# Patient Record
Sex: Male | Born: 1935 | Race: White | Hispanic: No | Marital: Married | Smoking: Former smoker
Health system: Southern US, Community
[De-identification: ages and names within clinical notes are randomized; demographics above are authoritative.]

## PROBLEM LIST (undated history)

## (undated) DIAGNOSIS — Z8719 Personal history of other diseases of the digestive system: Secondary | ICD-10-CM

## (undated) DIAGNOSIS — E7800 Pure hypercholesterolemia, unspecified: Secondary | ICD-10-CM

## (undated) DIAGNOSIS — I2510 Atherosclerotic heart disease of native coronary artery without angina pectoris: Secondary | ICD-10-CM

## (undated) DIAGNOSIS — F0390 Unspecified dementia without behavioral disturbance: Secondary | ICD-10-CM

## (undated) DIAGNOSIS — K5792 Diverticulitis of intestine, part unspecified, without perforation or abscess without bleeding: Secondary | ICD-10-CM

## (undated) DIAGNOSIS — S32050A Wedge compression fracture of fifth lumbar vertebra, initial encounter for closed fracture: Secondary | ICD-10-CM

## (undated) DIAGNOSIS — R413 Other amnesia: Secondary | ICD-10-CM

## (undated) DIAGNOSIS — I2699 Other pulmonary embolism without acute cor pulmonale: Secondary | ICD-10-CM

## (undated) HISTORY — PX: CARDIAC SURGERY: SHX584

## (undated) HISTORY — DX: Wedge compression fracture of fifth lumbar vertebra, initial encounter for closed fracture: S32.050A

## (undated) HISTORY — DX: Atherosclerotic heart disease of native coronary artery without angina pectoris: I25.10

## (undated) HISTORY — DX: Pure hypercholesterolemia, unspecified: E78.00

## (undated) HISTORY — PX: PROSTATE SURGERY: SHX751

## (undated) HISTORY — DX: Other pulmonary embolism without acute cor pulmonale: I26.99

## (undated) HISTORY — PX: ROTATOR CUFF REPAIR: SHX139

## (undated) HISTORY — PX: CORONARY ARTERY BYPASS GRAFT: SHX141

## (undated) HISTORY — DX: Other amnesia: R41.3

## (undated) HISTORY — DX: Unspecified dementia, unspecified severity, without behavioral disturbance, psychotic disturbance, mood disturbance, and anxiety: F03.90

## (undated) HISTORY — PX: HYDROCELE EXCISION / REPAIR: SUR1145

## (undated) HISTORY — DX: Personal history of other diseases of the digestive system: Z87.19

---

## 2006-06-26 HISTORY — PX: CORONARY ARTERY BYPASS GRAFT: SHX141

## 2006-08-01 ENCOUNTER — Ambulatory Visit: Payer: Self-pay | Admitting: Thoracic Surgery (Cardiothoracic Vascular Surgery)

## 2010-04-18 ENCOUNTER — Emergency Department (HOSPITAL_BASED_OUTPATIENT_CLINIC_OR_DEPARTMENT_OTHER): Admission: EM | Admit: 2010-04-18 | Discharge: 2010-04-19 | Payer: Self-pay | Admitting: Emergency Medicine

## 2010-09-07 LAB — COMPREHENSIVE METABOLIC PANEL
ALT: 18 U/L (ref 0–53)
AST: 26 U/L (ref 0–37)
CO2: 24 mEq/L (ref 19–32)
Calcium: 9.2 mg/dL (ref 8.4–10.5)
GFR calc Af Amer: >60 mL/min (ref >60)
GFR calc non Af Amer: 59 mL/min — ABNORMAL LOW (ref >60)
Potassium: 4.6 mEq/L (ref 3.5–5.1)
Sodium: 142 mEq/L (ref 135–145)
Total Protein: 7.8 g/dL (ref 6.0–8.3)

## 2010-09-07 LAB — STOOL CULTURE

## 2010-09-07 LAB — CULTURE, BLOOD (ROUTINE X 2)
Culture  Setup Time: 201110250743
Culture  Setup Time: 201110250743
Culture: NO GROWTH

## 2010-09-07 LAB — CBC
Hemoglobin: 17.6 g/dL — ABNORMAL HIGH (ref 13.0–17.0)
MCHC: 33.7 g/dL (ref 30.0–36.0)
WBC: 14.4 10*3/uL — ABNORMAL HIGH (ref 4.0–10.5)

## 2010-09-07 LAB — URINE MICROSCOPIC-ADD ON

## 2010-09-07 LAB — DIFFERENTIAL
Eosinophils Absolute: 0 10*3/uL (ref 0.0–0.7)
Eosinophils Relative: 0 % (ref 0–5)
Lymphs Abs: 1 10*3/uL (ref 0.7–4.0)
Monocytes Relative: 1 % — ABNORMAL LOW (ref 3–12)

## 2010-09-07 LAB — URINALYSIS, ROUTINE W REFLEX MICROSCOPIC
Glucose, UA: NEGATIVE mg/dL
Hgb urine dipstick: NEGATIVE
Ketones, ur: 15 mg/dL — AB
Protein, ur: 100 mg/dL — AB

## 2010-09-07 LAB — OVA AND PARASITE EXAMINATION

## 2013-11-07 DIAGNOSIS — N400 Enlarged prostate without lower urinary tract symptoms: Secondary | ICD-10-CM

## 2013-11-07 DIAGNOSIS — N401 Enlarged prostate with lower urinary tract symptoms: Secondary | ICD-10-CM | POA: Insufficient documentation

## 2013-11-07 DIAGNOSIS — N210 Calculus in bladder: Secondary | ICD-10-CM | POA: Insufficient documentation

## 2013-11-07 HISTORY — DX: Calculus in bladder: N21.0

## 2013-11-07 HISTORY — DX: Benign prostatic hyperplasia without lower urinary tract symptoms: N40.0

## 2013-12-03 DIAGNOSIS — M23329 Other meniscus derangements, posterior horn of medial meniscus, unspecified knee: Secondary | ICD-10-CM | POA: Insufficient documentation

## 2013-12-03 DIAGNOSIS — G4733 Obstructive sleep apnea (adult) (pediatric): Secondary | ICD-10-CM | POA: Insufficient documentation

## 2013-12-03 DIAGNOSIS — R634 Abnormal weight loss: Secondary | ICD-10-CM | POA: Insufficient documentation

## 2013-12-03 DIAGNOSIS — N433 Hydrocele, unspecified: Secondary | ICD-10-CM | POA: Insufficient documentation

## 2013-12-03 DIAGNOSIS — I10 Essential (primary) hypertension: Secondary | ICD-10-CM | POA: Insufficient documentation

## 2013-12-03 DIAGNOSIS — M542 Cervicalgia: Secondary | ICD-10-CM | POA: Insufficient documentation

## 2013-12-03 DIAGNOSIS — I4510 Unspecified right bundle-branch block: Secondary | ICD-10-CM | POA: Insufficient documentation

## 2013-12-03 DIAGNOSIS — J339 Nasal polyp, unspecified: Secondary | ICD-10-CM | POA: Insufficient documentation

## 2013-12-03 DIAGNOSIS — M2240 Chondromalacia patellae, unspecified knee: Secondary | ICD-10-CM | POA: Insufficient documentation

## 2013-12-03 DIAGNOSIS — L42 Pityriasis rosea: Secondary | ICD-10-CM

## 2013-12-03 DIAGNOSIS — H905 Unspecified sensorineural hearing loss: Secondary | ICD-10-CM

## 2013-12-03 DIAGNOSIS — R5383 Other fatigue: Secondary | ICD-10-CM | POA: Insufficient documentation

## 2013-12-03 DIAGNOSIS — E785 Hyperlipidemia, unspecified: Secondary | ICD-10-CM

## 2013-12-03 DIAGNOSIS — J329 Chronic sinusitis, unspecified: Secondary | ICD-10-CM | POA: Insufficient documentation

## 2013-12-03 DIAGNOSIS — H6990 Unspecified Eustachian tube disorder, unspecified ear: Secondary | ICD-10-CM | POA: Insufficient documentation

## 2013-12-03 HISTORY — DX: Nasal polyp, unspecified: J33.9

## 2013-12-03 HISTORY — DX: Unspecified right bundle-branch block: I45.10

## 2013-12-03 HISTORY — DX: Chondromalacia patellae, unspecified knee: M22.40

## 2013-12-03 HISTORY — DX: Pityriasis rosea: L42

## 2013-12-03 HISTORY — DX: Obstructive sleep apnea (adult) (pediatric): G47.33

## 2013-12-03 HISTORY — DX: Essential (primary) hypertension: I10

## 2013-12-03 HISTORY — DX: Other meniscus derangements, posterior horn of medial meniscus, unspecified knee: M23.329

## 2013-12-03 HISTORY — DX: Hydrocele, unspecified: N43.3

## 2013-12-03 HISTORY — DX: Abnormal weight loss: R63.4

## 2013-12-03 HISTORY — DX: Hyperlipidemia, unspecified: E78.5

## 2013-12-03 HISTORY — DX: Unspecified sensorineural hearing loss: H90.5

## 2014-02-16 DIAGNOSIS — Z8619 Personal history of other infectious and parasitic diseases: Secondary | ICD-10-CM

## 2014-02-16 HISTORY — DX: Personal history of other infectious and parasitic diseases: Z86.19

## 2014-05-04 DIAGNOSIS — Z800 Family history of malignant neoplasm of digestive organs: Secondary | ICD-10-CM

## 2014-05-04 HISTORY — DX: Family history of malignant neoplasm of digestive organs: Z80.0

## 2015-06-15 DIAGNOSIS — I499 Cardiac arrhythmia, unspecified: Secondary | ICD-10-CM | POA: Insufficient documentation

## 2015-06-15 DIAGNOSIS — R000 Tachycardia, unspecified: Secondary | ICD-10-CM | POA: Insufficient documentation

## 2015-06-15 HISTORY — DX: Cardiac arrhythmia, unspecified: I49.9

## 2015-06-16 DIAGNOSIS — E785 Hyperlipidemia, unspecified: Secondary | ICD-10-CM | POA: Insufficient documentation

## 2015-07-19 DIAGNOSIS — K219 Gastro-esophageal reflux disease without esophagitis: Secondary | ICD-10-CM

## 2015-07-19 HISTORY — DX: Gastro-esophageal reflux disease without esophagitis: K21.9

## 2016-03-10 ENCOUNTER — Encounter (HOSPITAL_BASED_OUTPATIENT_CLINIC_OR_DEPARTMENT_OTHER): Payer: Self-pay | Admitting: *Deleted

## 2016-03-10 ENCOUNTER — Emergency Department (HOSPITAL_BASED_OUTPATIENT_CLINIC_OR_DEPARTMENT_OTHER)
Admission: EM | Admit: 2016-03-10 | Discharge: 2016-03-10 | Disposition: A | Discharge status: Home / Self Care | Payer: Medicare HMO | Attending: Emergency Medicine | Admitting: Emergency Medicine

## 2016-03-10 ENCOUNTER — Emergency Department (HOSPITAL_BASED_OUTPATIENT_CLINIC_OR_DEPARTMENT_OTHER): Payer: Medicare HMO

## 2016-03-10 DIAGNOSIS — K5900 Constipation, unspecified: Secondary | ICD-10-CM

## 2016-03-10 DIAGNOSIS — K56609 Unspecified intestinal obstruction, unspecified as to partial versus complete obstruction: Secondary | ICD-10-CM

## 2016-03-10 DIAGNOSIS — I2510 Atherosclerotic heart disease of native coronary artery without angina pectoris: Secondary | ICD-10-CM | POA: Insufficient documentation

## 2016-03-10 DIAGNOSIS — R109 Unspecified abdominal pain: Secondary | ICD-10-CM | POA: Diagnosis present

## 2016-03-10 DIAGNOSIS — K5669 Other intestinal obstruction: Secondary | ICD-10-CM | POA: Diagnosis not present

## 2016-03-10 DIAGNOSIS — Z951 Presence of aortocoronary bypass graft: Secondary | ICD-10-CM | POA: Insufficient documentation

## 2016-03-10 DIAGNOSIS — Z87891 Personal history of nicotine dependence: Secondary | ICD-10-CM | POA: Insufficient documentation

## 2016-03-10 HISTORY — DX: Atherosclerotic heart disease of native coronary artery without angina pectoris: I25.10

## 2016-03-10 HISTORY — DX: Diverticulitis of intestine, part unspecified, without perforation or abscess without bleeding: K57.92

## 2016-03-10 LAB — COMPREHENSIVE METABOLIC PANEL
ALBUMIN: 4.4 g/dL (ref 3.5–5.0)
ALK PHOS: 57 U/L (ref 38–126)
ALT: 15 U/L — AB (ref 17–63)
AST: 23 U/L (ref 15–41)
Anion gap: 8 (ref 5–15)
BUN: 30 mg/dL — ABNORMAL HIGH (ref 6–20)
CALCIUM: 9.5 mg/dL (ref 8.9–10.3)
CO2: 28 mmol/L (ref 22–32)
CREATININE: 0.99 mg/dL (ref 0.61–1.24)
Chloride: 106 mmol/L (ref 101–111)
GFR calc Af Amer: >60 mL/min (ref >60)
GFR calc non Af Amer: >60 mL/min (ref >60)
GLUCOSE: 115 mg/dL — AB (ref 65–99)
Potassium: 4.5 mmol/L (ref 3.5–5.1)
SODIUM: 142 mmol/L (ref 135–145)
Total Bilirubin: 1.3 mg/dL — ABNORMAL HIGH (ref 0.3–1.2)
Total Protein: 7.5 g/dL (ref 6.5–8.1)

## 2016-03-10 LAB — CBC WITH DIFFERENTIAL/PLATELET
BASOS ABS: 0 10*3/uL (ref 0.0–0.1)
BASOS PCT: 0 %
EOS ABS: 0.1 10*3/uL (ref 0.0–0.7)
Eosinophils Relative: 1 %
HCT: 42.8 % (ref 39.0–52.0)
HEMOGLOBIN: 14.1 g/dL (ref 13.0–17.0)
LYMPHS ABS: 1.1 10*3/uL (ref 0.7–4.0)
Lymphocytes Relative: 12 %
MCH: 27.2 pg (ref 26.0–34.0)
MCHC: 32.9 g/dL (ref 30.0–36.0)
MCV: 82.6 fL (ref 78.0–100.0)
Monocytes Absolute: 0.4 10*3/uL (ref 0.1–1.0)
Monocytes Relative: 5 %
NEUTROS PCT: 82 %
Neutro Abs: 7 10*3/uL (ref 1.7–7.7)
Platelets: 215 10*3/uL (ref 150–400)
RBC: 5.18 MIL/uL (ref 4.22–5.81)
RDW: 15.1 % (ref 11.5–15.5)
WBC: 8.5 10*3/uL (ref 4.0–10.5)

## 2016-03-10 LAB — LIPASE, BLOOD: Lipase: 22 U/L (ref 11–51)

## 2016-03-10 MED ORDER — SODIUM CHLORIDE 0.9 % IV BOLUS (SEPSIS)
500.0000 mL | Freq: Once | INTRAVENOUS | Status: AC
  Administered 2016-03-10: 500 mL via INTRAVENOUS

## 2016-03-10 MED ORDER — IOPAMIDOL (ISOVUE-300) INJECTION 61%
100.0000 mL | Freq: Once | INTRAVENOUS | Status: AC | PRN
  Administered 2016-03-10: 100 mL via INTRAVENOUS

## 2016-03-10 MED ORDER — ONDANSETRON 4 MG PO TBDP: ORAL_TABLET | ORAL | 0 refills | Status: DC

## 2016-03-10 MED ORDER — ONDANSETRON HCL 4 MG/2ML IJ SOLN
4.0000 mg | Freq: Once | INTRAMUSCULAR | Status: AC
  Administered 2016-03-10: 4 mg via INTRAVENOUS
  Filled 2016-03-10: qty 2

## 2016-03-10 NOTE — ED Notes (Signed)
Pt c/o nausea.  MD notified.  Order received.

## 2016-03-10 NOTE — Discharge Instructions (Signed)
Take 8 scoops of miralax in 32 oz of whatever you want to drink.  Drink this over the course of the day.  Stay near a bathroom.  Also use a fleets enema, you can buy this at the pharmacy.

## 2016-03-10 NOTE — ED Triage Notes (Signed)
Pt c/o abd "bloating" x 1 day c/o no BM x 3 days HX diverticulitis

## 2016-03-10 NOTE — ED Provider Notes (Signed)
. [facility] Provider Note   CSN: [id number] Arrival date & time: 03/10/16  1958 By signing my name below, I, Bridgette HabermannMaria Tan, attest that this documentation has been prepared under the direction and in the presence of Melene Planan Janeal Abadi, DO. Electronically Signed: Bridgette HabermannMaria Tan, ED Scribe. 03/10/16. 8:19 PM.  History   Chief Complaint Chief Complaint  Patient presents with  . Bloated   HPI Comments: Steven Doyle is a 82 y.o. male with h/o diverticulitis who presents to the Emergency Department complaining of mild abdominal pain with bloating onset one day ago. Pt has not had a bowel movement in 3 days. Pt states he regularly takes medication to regulate his bowel movements and he has been compliant with it. Pt denies h/o abdominal surgeries. Denies fever, vomiting, nausea, or any other associated symptoms.   The history is provided by the patient. No language interpreter was used.    Past Medical History:  Diagnosis Date  . Coronary artery disease   . Diverticulitis     There are no active problems to display for this patient.   Past Surgical History:  Procedure Laterality Date  . CORONARY ARTERY BYPASS GRAFT         Home Medications    Prior to Admission medications   Medication Sig Start Date End Date Taking? Authorizing Provider  ondansetron (ZOFRAN ODT) 4 MG disintegrating tablet 4mg  ODT q4 hours prn nausea/vomit 03/10/16   Melene Planan Alishia Lebo, DO    Family History History reviewed. No pertinent family history.  Social History Social History  Substance Use Topics  . Smoking status: Former Games developermoker  . Smokeless tobacco: Not on file  . Alcohol use No     Allergies   Review of patient's allergies indicates no known allergies.   Review of Systems Review of Systems  Constitutional: Negative for chills and fever.  HENT: Negative for congestion and facial swelling.   Eyes: Negative for discharge and visual disturbance.  Respiratory: Negative for shortness of breath.     Cardiovascular: Negative for chest pain and palpitations.  Gastrointestinal: Negative for abdominal pain, diarrhea, nausea and vomiting.  Musculoskeletal: Negative for arthralgias and myalgias.  Skin: Negative for color change and rash.  Neurological: Negative for tremors, syncope and headaches.  Psychiatric/Behavioral: Negative for confusion and dysphoric mood.  All other systems reviewed and are negative.    Physical Exam Updated Vital Signs BP 167/89 (BP Location: Right Arm)   Pulse 64   Temp 98.3 F (36.8 C) (Oral)   Resp 16   Ht 6\' 2"  (1.88 m)   Wt 199 lb (90.3 kg)   SpO2 94%   BMI 25.55 kg/m   Physical Exam  Constitutional: He is oriented to person, place, and time. He appears well-developed and well-nourished.  HENT:  Head: Normocephalic and atraumatic.  Eyes: EOM are normal. Pupils are equal, round, and reactive to light.  Neck: Normal range of motion. Neck supple. No JVD present.  Cardiovascular: Normal rate and regular rhythm.  Exam reveals no gallop and no friction rub.   No murmur heard. Pulmonary/Chest: No respiratory distress. He has no wheezes.  Abdominal: Soft. He exhibits no distension. There is tenderness. There is no rebound and no guarding.  Tympanitic to percussion. Mild, diffuse abdominal pain.  Musculoskeletal: Normal range of motion.  Neurological: He is alert and oriented to person, place, and time.  Skin: No rash noted. No pallor.  Psychiatric: He has a normal mood and affect. His behavior is normal.  Nursing note and  vitals reviewed.    ED Treatments / Results  DIAGNOSTIC STUDIES: Oxygen Saturation is 100% on RA, normal by my interpretation.    COORDINATION OF CARE: 8:18 PM Discussed treatment plan with pt at bedside which includes abdomen CT and pt agreed to plan.  Labs (all labs ordered are listed, but only abnormal results are displayed) Labs Reviewed  COMPREHENSIVE METABOLIC PANEL - Abnormal; Notable for the following:       Result  Value   Glucose, Bld 115 (*)    BUN 30 (*)    ALT 15 (*)    Total Bilirubin 1.3 (*)    All other components within normal limits  CBC WITH DIFFERENTIAL/PLATELET  LIPASE, BLOOD    EKG  EKG Interpretation None       Radiology Ct Abdomen Pelvis W Contrast  Result Date: 03/10/2016 CLINICAL DATA:  Acute onset of mid abdominal pain and bloating. Initial encounter. EXAM: CT ABDOMEN AND PELVIS WITH CONTRAST TECHNIQUE: Multidetector CT imaging of the abdomen and pelvis was performed using the standard protocol following bolus administration of intravenous contrast. CONTRAST:  ISOVUE-300 IOPAMIDOL (ISOVUE-300) INJECTION 61% COMPARISON:  CT of the abdomen and pelvis from 12/29/2008 FINDINGS: Lower chest: Bronchiectasis is noted at the lower lobes, worse on the left, with associated scarring at the left lung base. The patient is status post median sternotomy. Scattered coronary artery calcifications are seen. Hepatobiliary: The liver is unremarkable in appearance. The patient is status post cholecystectomy, with clips noted at the gallbladder fossa. The common bile duct remains normal in caliber. Pancreas: The pancreas is within normal limits. Spleen: The spleen is unremarkable in appearance. Adrenals/Urinary Tract: The adrenal glands are unremarkable in appearance. A 4.1 cm cyst is noted at the lower pole of the right kidney. There is an isodense 2.4 cm cystic focus at the medial aspect of the left kidney, only minimally changed from 2010 and likely benign. A 4 mm stone is noted at the interpole region of the left kidney. There is no evidence of hydronephrosis. No obstructing ureteral stones are identified. No perinephric stranding is seen. Stomach/Bowel: There is distention of small-bowel loops to 4.8 cm in maximal diameter, with apparent gradual decompression, which may reflect partial small bowel obstruction or small bowel dysmotility. There is partial decompression of distal small bowel loops. No  well defined transition point is identified. The appendix is normal in caliber, without evidence of appendicitis. Mild diverticulosis is noted along the descending and sigmoid colon, without evidence of diverticulitis. Vascular/Lymphatic: Scattered calcification is seen along the abdominal aorta and its branches. The abdominal aorta is otherwise grossly unremarkable. The inferior vena cava is grossly unremarkable. No retroperitoneal lymphadenopathy is seen. No pelvic sidewall lymphadenopathy is identified. Reproductive: The prostate is mildly enlarged, measuring 5.3 cm in transverse dimension, with underlying heterogeneity. The bladder is mildly distended. Apparent bladder wall thickening at the left side of the base of the bladder is nonspecific. Other: Trace free fluid is seen within the pelvis, of uncertain significance. A small left inguinal hernia is noted, containing only fat. Musculoskeletal: No acute osseous abnormalities are identified. There is chronic loss of height involving the superior endplate of L4. The visualized musculature is unremarkable in appearance. IMPRESSION: 1. Distention of small-bowel loops to 4.8 cm in maximal diameter, with apparent gradual decompression, which may reflect partial small bowel obstruction or small bowel dysmotility. No well defined transition point seen. 2. Trace free fluid within the pelvis, of uncertain significance. 3. Apparent bladder wall thickening at the left  side of the base of the bladder is nonspecific. Cystoscopy would be helpful for further evaluation, when and as deemed clinically appropriate. 4. Bilateral renal cysts noted, including an isodense focus at the left kidney, only minimally changed from 2010 and likely benign. 5. 4 mm nonobstructing stone at the interpole region of the left kidney. 6. Mild diverticulosis along the descending and sigmoid colon, without evidence of diverticulitis. 7. Mildly enlarged prostate noted. 8. Small left inguinal hernia,  containing only fat. 9. Chronic loss of height involving the superior endplate of L4. 10. Bronchiectasis at the lower lung lobes, worse on the left, with scarring at the left lung base. 11. Scattered coronary artery calcifications seen. Electronically Signed   By: Roanna Raider M.D.   On: 03/10/2016 22:57    Procedures Procedures (including critical care time)  Medications Ordered in ED Medications  sodium chloride 0.9 % bolus 500 mL (0 mLs Intravenous Stopped 03/10/16 2152)  iopamidol (ISOVUE-300) 61 % injection 100 mL (100 mLs Intravenous Contrast Given 03/10/16 2205)  ondansetron (ZOFRAN) injection 4 mg (4 mg Intravenous Given 03/10/16 2249)     Initial Impression / Assessment and Plan / ED Course  I have reviewed the triage vital signs and the nursing notes.  Pertinent labs & imaging results that were available during my care of the patient were reviewed by me and considered in my medical decision making (see chart for details).  Clinical Course    82 yo M With a chief complaint of constipation. Patient has not had a bowel movement in the past 4 days. Having some mild diffuse abdominal pain. Nausea but denies vomiting. Patient tried a special tea this morning which did not improve his symptoms. Is no history of abdominal surgery. My exam patient has a tympanitic abdomen. No focal tenderness. Discussed risks and benefits of CT scanning which the patient elects to have. CT scan read as possible slow motility versus partial small bowel obstruction. I discussed results with the patient shared decision-making at bedside. I offered admission, patient currently electing for trial of MiraLAX and enema.  11:31 PM:  I have discussed the diagnosis/risks/treatment options with the patient and family and believe the pt to be eligible for discharge home to follow-up with PCP. We also discussed returning to the ED immediately if new or worsening sx occur. We discussed the sx which are most concerning  (e.g., sudden worsening pain, uncontrolled vomiting) that necessitate immediate return. Medications administered to the patient during their visit and any new prescriptions provided to the patient are listed below.  Medications given during this visit Medications  sodium chloride 0.9 % bolus 500 mL (0 mLs Intravenous Stopped 03/10/16 2152)  iopamidol (ISOVUE-300) 61 % injection 100 mL (100 mLs Intravenous Contrast Given 03/10/16 2205)  ondansetron (ZOFRAN) injection 4 mg (4 mg Intravenous Given 03/10/16 2249)     The patient appears reasonably screen and/or stabilized for discharge and I doubt any other medical condition or other [facility] requiring further screening, evaluation, or treatment in the ED at this time prior to discharge.    Final Clinical Impressions(s) / ED Diagnoses   Final diagnoses:  Constipation, unspecified constipation type  SBO (small bowel obstruction) (HCC)    New Prescriptions New Prescriptions   ONDANSETRON (ZOFRAN ODT) 4 MG DISINTEGRATING TABLET    4mg  ODT q4 hours prn nausea/vomit   I personally performed the services described in this documentation, which was scribed in my presence. The recorded information has been reviewed and is accurate.  Melene Plan, DO 03/10/16 2331

## 2016-03-10 NOTE — ED Notes (Signed)
Edp at bedside discussing test results and dispo instructions. 

## 2017-05-16 DIAGNOSIS — I2510 Atherosclerotic heart disease of native coronary artery without angina pectoris: Secondary | ICD-10-CM | POA: Insufficient documentation

## 2017-05-16 HISTORY — DX: Atherosclerotic heart disease of native coronary artery without angina pectoris: I25.10

## 2018-02-04 ENCOUNTER — Other Ambulatory Visit: Payer: Self-pay

## 2018-02-04 ENCOUNTER — Emergency Department (HOSPITAL_BASED_OUTPATIENT_CLINIC_OR_DEPARTMENT_OTHER): Payer: Medicare HMO

## 2018-02-04 ENCOUNTER — Encounter (HOSPITAL_BASED_OUTPATIENT_CLINIC_OR_DEPARTMENT_OTHER): Payer: Self-pay | Admitting: *Deleted

## 2018-02-04 ENCOUNTER — Emergency Department (HOSPITAL_BASED_OUTPATIENT_CLINIC_OR_DEPARTMENT_OTHER)
Admission: EM | Admit: 2018-02-04 | Discharge: 2018-02-04 | Disposition: A | Discharge status: Home / Self Care | Payer: Medicare HMO | Attending: Emergency Medicine | Admitting: Emergency Medicine

## 2018-02-04 DIAGNOSIS — J9589 Other postprocedural complications and disorders of respiratory system, not elsewhere classified: Secondary | ICD-10-CM

## 2018-02-04 DIAGNOSIS — Z87891 Personal history of nicotine dependence: Secondary | ICD-10-CM | POA: Diagnosis not present

## 2018-02-04 DIAGNOSIS — Z79899 Other long term (current) drug therapy: Secondary | ICD-10-CM | POA: Diagnosis not present

## 2018-02-04 DIAGNOSIS — R0602 Shortness of breath: Secondary | ICD-10-CM | POA: Diagnosis present

## 2018-02-04 DIAGNOSIS — J40 Bronchitis, not specified as acute or chronic: Secondary | ICD-10-CM | POA: Insufficient documentation

## 2018-02-04 DIAGNOSIS — Z951 Presence of aortocoronary bypass graft: Secondary | ICD-10-CM | POA: Diagnosis not present

## 2018-02-04 DIAGNOSIS — I2510 Atherosclerotic heart disease of native coronary artery without angina pectoris: Secondary | ICD-10-CM | POA: Insufficient documentation

## 2018-02-04 LAB — CBC
HEMATOCRIT: 39.3 % (ref 39.0–52.0)
HEMOGLOBIN: 13.5 g/dL (ref 13.0–17.0)
MCH: 30.3 pg (ref 26.0–34.0)
MCHC: 34.4 g/dL (ref 30.0–36.0)
MCV: 88.1 fL (ref 78.0–100.0)
Platelets: 179 10*3/uL (ref 150–400)
RBC: 4.46 MIL/uL (ref 4.22–5.81)
RDW: 13.8 % (ref 11.5–15.5)
WBC: 5.2 10*3/uL (ref 4.0–10.5)

## 2018-02-04 LAB — COMPREHENSIVE METABOLIC PANEL
ALK PHOS: 55 U/L (ref 38–126)
ALT: 16 U/L (ref 0–44)
AST: 28 U/L (ref 15–41)
Albumin: 4.1 g/dL (ref 3.5–5.0)
Anion gap: 7 (ref 5–15)
BILIRUBIN TOTAL: 0.7 mg/dL (ref 0.3–1.2)
BUN: 20 mg/dL (ref 8–23)
CALCIUM: 9.1 mg/dL (ref 8.9–10.3)
CO2: 28 mmol/L (ref 22–32)
Chloride: 106 mmol/L (ref 98–111)
Creatinine, Ser: 1.05 mg/dL (ref 0.61–1.24)
GFR calc Af Amer: >60 mL/min (ref >60)
GFR calc non Af Amer: >60 mL/min (ref >60)
GLUCOSE: 121 mg/dL — AB (ref 70–99)
Potassium: 3.9 mmol/L (ref 3.5–5.1)
Sodium: 141 mmol/L (ref 135–145)
TOTAL PROTEIN: 7.1 g/dL (ref 6.5–8.1)

## 2018-02-04 LAB — TROPONIN I
Troponin I: <0.03 ng/mL
Troponin I: <0.03 ng/mL (ref <0.03)

## 2018-02-04 LAB — D-DIMER, QUANTITATIVE: D-Dimer, Quant: 1.39 ug/mL-FEU — ABNORMAL HIGH (ref 0.00–0.50)

## 2018-02-04 LAB — BRAIN NATRIURETIC PEPTIDE: B Natriuretic Peptide: 56.1 pg/mL (ref 0.0–100.0)

## 2018-02-04 MED ORDER — IOPAMIDOL (ISOVUE-370) INJECTION 76%
100.0000 mL | Freq: Once | INTRAVENOUS | Status: AC | PRN
  Administered 2018-02-04: 81 mL via INTRAVENOUS

## 2018-02-04 NOTE — ED Provider Notes (Signed)
[facility] EMERGENCY DEPARTMENT Provider Note  CSN: [id number] Arrival date & time: 02/04/18 0032  Chief Complaint(s) Shortness of Breath  HPI Steven Doyle is a 84 y.o. male with a history of CAD status post 7 bypass surgeries who presents to the emergency department with sudden onset shortness of breath that began 1 to 2 hours prior to arrival.  He describes it as having to breathe faster than normal.  Patient reports associated nausea without emesis.  Denies any chest pain or discomfort.  No abdominal pain.  No back pain.  No headache.  Of note patient had recent scrotal hydrocele surgery 5 days ago.  Reports that the surgical site is well-appearing and has not noticed any major changes.    He denies any associated fevers or chills.  No coughing or congestion.  Denies any prior MIs.  Patient had a recent cardiology scan at [facility] which I am not able to bring up on care everywhere.  Denies any prior DVT/PE.  HPI  Past Medical History Past Medical History:  Diagnosis Date  . Coronary artery disease   . Diverticulitis    There are no active problems to display for this patient.  Home Medication(s) Prior to Admission medications   Medication Sig Start Date End Date Taking? Authorizing Provider  rosuvastatin (CRESTOR) 5 MG tablet Take 5 mg by mouth daily.   Yes [provider]  ondansetron (ZOFRAN ODT) 4 MG disintegrating tablet 4mg  ODT q4 hours prn nausea/vomit 03/10/16   Melene Plan, DO                                                                                                                                    Past Surgical History Past Surgical History:  Procedure Laterality Date  . CORONARY ARTERY BYPASS GRAFT    . HYDROCELE EXCISION / REPAIR    . PROSTATE SURGERY    . ROTATOR CUFF REPAIR     Family History History reviewed. No pertinent family history.  Social History Social History   Tobacco Use  . Smoking status: Former  Games developer  . Smokeless tobacco: Never Used  Substance Use Topics  . Alcohol use: No  . Drug use: Never   Allergies Patient has no known allergies.  Review of Systems Review of Systems All other systems are reviewed and are negative for acute change except as noted in the HPI  Physical Exam Vital Signs  I have reviewed the triage vital signs BP (!) 175/86   Pulse (!) 56   Temp 97.9 F (36.6 C) (Oral)   Resp 16   Ht 6\' 2"  (1.88 m)   Wt 89.8 kg   SpO2 96%   BMI 25.42 kg/m   Physical Exam  Constitutional: He is oriented to person, place, and time. He appears well-developed and well-nourished. No distress.  HENT:  Head: Normocephalic and atraumatic.  Nose: Nose normal.  Eyes: Pupils  are equal, round, and reactive to light. Conjunctivae and EOM are normal. Right eye exhibits no discharge. Left eye exhibits no discharge. No scleral icterus.  Neck: Normal range of motion. Neck supple.  Cardiovascular: Normal rate and regular rhythm. Exam reveals no gallop and no friction rub.  No murmur heard. Pulmonary/Chest: Effort normal and breath sounds normal. No accessory muscle usage or stridor. No tachypnea. No respiratory distress. He has no wheezes. He has no rhonchi. He has no rales.  Abdominal: Soft. He exhibits no distension. There is no tenderness.  Genitourinary:     Musculoskeletal: He exhibits no edema or tenderness.  Neurological: He is alert and oriented to person, place, and time.  Skin: Skin is warm and dry. No rash noted. He is not diaphoretic. No erythema.  Psychiatric: He has a normal mood and affect.  Vitals reviewed.   ED Results and Treatments Labs (all labs ordered are listed, but only abnormal results are displayed) Labs Reviewed  COMPREHENSIVE METABOLIC PANEL - Abnormal; Notable for the following components:      Result Value   Glucose, Bld 121 (*)    All other components within normal limits  D-DIMER, QUANTITATIVE (NOT AT [facility]) - Abnormal; Notable for the  following components:   D-Dimer, Quant 1.39 (*)    All other components within normal limits  CBC  BRAIN NATRIURETIC PEPTIDE  TROPONIN I  TROPONIN I                                                                                                                         EKG  EKG Interpretation  Date/Time:  Monday February 04 2018 00:37:38 EDT Ventricular Rate:  57 PR Interval:    QRS Duration: 191 QT Interval:  469 QTC Calculation: 457 R Axis:   -39 Text Interpretation:  Sinus rhythm Prolonged PR interval Right bundle branch block mild STD in the anterior leads No old tracing to compare Confirmed by Drema Pry [phone]) on 02/04/2018 12:43:22 AM       EKG Interpretation  Date/Time:  Monday February 04 2018 00:52:29 EDT Ventricular Rate:  56 PR Interval:    QRS Duration: 184 QT Interval:  469 QTC Calculation: 453 R Axis:   -104 Text Interpretation:  Sinus rhythm Prolonged PR interval Nonspecific IVCD with LAD posterior view. V1,V2, V3 leads used NO STEMI Confirmed by Drema Pry [phone]) on 02/04/2018 1:36:26 AM       Radiology Dg Chest 2 View  Result Date: 02/04/2018 CLINICAL DATA:  Shortness of breath EXAM: CHEST - 2 VIEW COMPARISON:  None. FINDINGS: Cardiac shadows within normal limits. Postsurgical changes are noted. The lungs are well aerated bilaterally. No focal infiltrate or sizable effusion is seen. No acute bony abnormality is noted. IMPRESSION: No active cardiopulmonary disease. Electronically Signed   By: Alcide Clever M.D.   On: 02/04/2018 01:20   Ct Angio Chest Pe W And/or Wo Contrast  Result Date: 02/04/2018 CLINICAL DATA:  Chest pain and shortness of breath  starting this evening. Surgery for hydrocele 6 days ago. Elevated D-dimer. EXAM: CT ANGIOGRAPHY CHEST WITH CONTRAST TECHNIQUE: Multidetector CT imaging of the chest was performed using the standard protocol during bolus administration of intravenous contrast. Multiplanar CT image reconstructions and MIPs were  obtained to evaluate the vascular anatomy. CONTRAST:  81mL ISOVUE-370 IOPAMIDOL (ISOVUE-370) INJECTION 76% COMPARISON:  None. FINDINGS: Cardiovascular: Good opacification of the central and segmental pulmonary arteries. No focal filling defects. No evidence of significant pulmonary embolus. Normal caliber thoracic aorta. Aortic and coronary artery calcifications. Postoperative changes consistent with coronary bypass. Mild cardiac enlargement. No pericardial effusions. Mediastinum/Nodes: Esophagus is decompressed. No significant lymphadenopathy in the chest. Surgical clips. Calcified lymph nodes. Lungs/Pleura: In the left lung base, there is peribronchial thickening with mild hazy peribronchial infiltrates. This may represent bronchitis. No pleural effusions. No pneumothorax. Upper Abdomen: No acute abnormalities demonstrated. Right renal cyst. Musculoskeletal: Degenerative changes in the spine. Sternotomy wires. Review of the MIP images confirms the above findings. IMPRESSION: 1. No evidence of significant pulmonary embolus. 2. Bronchial wall thickening with hazy peribronchial infiltrates in the left lung base suggesting bronchitis. 3. Aortic atherosclerosis. 4. Postoperative coronary bypass. Aortic Atherosclerosis (ICD10-I70.0). Electronically Signed   By: Burman NievesWilliam  Stevens M.D.   On: 02/04/2018 02:33   Pertinent labs & imaging results that were available during my care of the patient were reviewed by me and considered in my medical decision making (see chart for details).  Medications Ordered in ED Medications  iopamidol (ISOVUE-370) 76 % injection 100 mL (81 mLs Intravenous Contrast Given 02/04/18 0152)                                                                                                                                    Procedures Procedures  (including critical care time)  Medical Decision Making / ED Course I have reviewed the nursing notes for this encounter and the patient's prior  records (if available in EHR or on provided paperwork).     1. SOB Sudden onset shortness of breath and nausea.  No associated chest pain.  Patient does not appear to be in any respiratory distress.  Lungs clear to auscultation bilaterally. Sating well on RA.    EKG with right bundle branch block and minimal anterior ST segment depression.  No prior EKG for comparison however on care everywhere the interpretation does note right bundle branch block.  Posterior EKG obtained and no evidence of ST segment elevation posteriorly.  Initial troponin negative.  BNP within normal limits.  Doubt cardiac etiology however given his extensive cardiac history, will obtain a delta troponin.  Chest x-ray without evidence suggestive of pneumonia, pneumothorax, pneumomediastinum.  No abnormal contour of the mediastinum to suggest dissection. No evidence of acute injuries.  Given the recent surgery, and sudden onset of shortness of breath, d-dimer was obtained and was elevated. CTA w/o evidence of PE, PNA, pulmonary edema, or pleural effusions. It did reveal  evidence of bronchitis.  Delta troponin negative.   2. Recent hydrocelectomy Scrotum with swelling and mild hyperemia however no erythema or tenderness to palpation.  It does not appear to be infected.   The patient appears reasonably screened and/or stabilized for discharge and I doubt any other medical condition or other [facility] requiring further screening, evaluation, or treatment in the ED at this time prior to discharge.  The patient is safe for discharge with strict return precautions.   Final Clinical Impression(s) / ED Diagnoses Final diagnoses:  SOB (shortness of breath)  Bronchitis after surgery   Disposition: Discharge  Condition: Good  I have discussed the results, Dx and Tx plan with the patient who expressed understanding and agree(s) with the plan. Discharge instructions discussed at great length. The patient was given strict return  precautions who verbalized understanding of the instructions. No further questions at time of discharge.    ED Discharge Orders    None       Follow Up: Dan Makerrr, Richard L., MD [id number] [place] [address] [phone]  Schedule an appointment as soon as possible for a visit  in 3-5 days, If symptoms do not improve or  worsen      This chart was dictated using voice recognition software.  Despite best efforts to proofread,  errors can occur which can change the documentation meaning.   Nira Connardama, Angelena Sand Eduardo, MD 02/04/18 [phone]

## 2018-02-04 NOTE — ED Notes (Signed)
No changes. Alert, NAD, calm, interactive, resps e/u, no dyspnea, VSS. Pending delta troponin. [facility]

## 2018-02-04 NOTE — ED Notes (Signed)
EDP at [facility], pt updated. 

## 2018-02-04 NOTE — ED Triage Notes (Addendum)
Here for sob, onset PTA. Also mentions nausea. Significant cardiac hx noted. Dr[facility] and [facility] assessment[facility]   Recent GU surgery reported. Alert, NAD, calm, interactive, resps e/u, speaking in clear complete sentences, no dyspnea noted, skin W&D, VSS, (denies: pain, fever, VD, dizziness or visual changes).

## 2018-02-04 NOTE — ED Notes (Signed)
EDP at [facility] 

## 2018-02-04 NOTE — ED Notes (Signed)
No changes. Alert, NAD, calm, interactive, to xray. (Denies: pain, sob, nausea, dizziness or visual changes at this time).

## 2018-03-04 DIAGNOSIS — Z7982 Long term (current) use of aspirin: Secondary | ICD-10-CM

## 2018-03-04 HISTORY — DX: Long term (current) use of aspirin: Z79.82

## 2018-05-25 DIAGNOSIS — E559 Vitamin D deficiency, unspecified: Secondary | ICD-10-CM

## 2018-05-25 HISTORY — DX: Vitamin D deficiency, unspecified: E55.9

## 2018-07-18 ENCOUNTER — Other Ambulatory Visit: Payer: Self-pay | Admitting: Family Medicine

## 2018-07-18 DIAGNOSIS — S32000A Wedge compression fracture of unspecified lumbar vertebra, initial encounter for closed fracture: Secondary | ICD-10-CM

## 2018-07-18 DIAGNOSIS — S22000A Wedge compression fracture of unspecified thoracic vertebra, initial encounter for closed fracture: Secondary | ICD-10-CM

## 2018-07-31 ENCOUNTER — Encounter: Payer: Self-pay | Admitting: Radiology

## 2018-07-31 ENCOUNTER — Ambulatory Visit
Admission: RE | Admit: 2018-07-31 | Discharge: 2018-07-31 | Disposition: A | Discharge status: Home / Self Care | Payer: Medicare HMO | Source: Ambulatory Visit | Attending: Family Medicine | Admitting: Family Medicine

## 2018-07-31 DIAGNOSIS — S22000A Wedge compression fracture of unspecified thoracic vertebra, initial encounter for closed fracture: Secondary | ICD-10-CM

## 2018-07-31 DIAGNOSIS — S32000A Wedge compression fracture of unspecified lumbar vertebra, initial encounter for closed fracture: Secondary | ICD-10-CM

## 2018-07-31 HISTORY — PX: IR RADIOLOGIST EVAL & MGMT: IMG5224

## 2018-07-31 NOTE — Consult Note (Signed)
Chief Complaint: Patient was seen in consultation today for  Chief Complaint  Patient presents with  . Consult    Consult for Kyphoplasty T11, L1, L3     at the request of Bradford,Catherine R  Referring Physician(s): Bradford,Catherine R  History of Present Illness: Steven Doyle is a 85 y.o. male Who presents for consultation regarding his persistent low back pain.  He had a lifting injury in September of last year when getting ready for a camping trip.  This resulted in lumbar pain with some radiation into his left lower extremity.  The low back pain persist but he now describes pain down to his right lower extremity primarily above the knee.  No lower extremity numbness or weakness.  No new bowel or bladder control issues.  He was initially treated with hydrocodone, Valium, and meloxicam.  These were ineffective for pain control.  He is currently taking oxycodone 3 times a day, and tramadol as needed when he has to drive.  He rates the pain 8 out of 10 on the visual analog pain scale at its worst, improved to 3 out of 10 with medication.  Pain is exacerbated by driving, yard work.  Patient is worse  sitting than standing or walking.  Pain   is improved with certain positions in bed, although sometimes the pain wakes him at night.  He was started on vitamin D and calcium as bone building therapy.  We discussed Forteo but found it unaffordable.  Of note, his daughter who he recently lost to metastatic breast carcinoma was a patient of mine who obtained good relief from vertebral augmentation.  He presents for discussion of treatment options. Past Medical History:  Diagnosis Date  . Coronary artery disease   . Diverticulitis    Cardiovascular and Mediastinum  Hypertension      Cardiac dysrhythmia   Premature ventricular contraction   Right bundle branch block   Multiple subsegmental pulmonary emboli without acute cor pulmonale   Acute saddle pulmonary embolism without acute  cor pulmonale (HCC)   Pulmonary emboli (HCC)   Bilateral pulmonary embolism (HCC)   Respiratory  Ulcer of hard palate   Perennial allergic rhinitis   Chronic sinusitis   Nasal polyps   Digestive  GERD without esophagitis   Endocrine  Testicular hypofunction   Nervous and Auditory  Dysfunction of eustachian tube   Sensorineural hearing loss   Musculoskeletal and Integument  Chondromalacia of patella   Degenerative disc disease, lumbar   Derangement of posterior horn of medial meniscus   Osteoarthritis   Pityriasis rosea   Genitourinary  Benign prostatic hyperplasia with urinary obstruction   Bladder stone   BPH with urinary obstruction   Calculus of kidney   Hydrocele of testis   Urinary tract infection without hematuria   AKI (acute kidney injury) (HCC)   Other  Constipation   Family history- stomach cancer   Hyperlipidemia   Memory difficulty   Testicle pain   Long term (current) use of aspirin   Hx of CABG   Cold extremities   Unintentional weight loss   Dyslipidemia   Fatigue   History of herpes zoster   Incomplete bladder emptying   Overweight   Polycythemia   Tachycardia   Vitamin D deficiency   History of vertebral fracture   Dehydration   Positive colorectal cancer screening using Cologuard test   History of colon polyps      Past Surgical History:  Procedure Laterality  Date  . CORONARY ARTERY BYPASS GRAFT    . HYDROCELE EXCISION / REPAIR    . IR RADIOLOGIST EVAL & MGMT  07/31/2018  . PROSTATE SURGERY    . ROTATOR CUFF REPAIR      Allergies: Atorvastatin  Medications: Prior to Admission medications   Medication Sig Start Date End Date Taking? Authorizing Provider  cetirizine (ZYRTEC) 10 MG tablet Take 10 mg by mouth daily as needed.   Yes [provider]  Cholecalciferol (D3-50) 1.25 MG (50000 UT) capsule Take by mouth. 06/05/18  Yes [provider]  diphenhydrAMINE (BENADRYL) 25 MG tablet Take by mouth.   Yes [provider]  fluticasone (FLONASE) 50 MCG/ACT nasal spray 2 sprays by Each Nare route once daily.   Yes [provider]  Multiple Vitamin (MULTIVITAMIN) tablet Take by mouth.   Yes [provider]  Omega-3 1000 MG CAPS Take by mouth.   Yes [provider]  ondansetron (ZOFRAN ODT) 4 MG disintegrating tablet 4mg  ODT q4 hours prn nausea/vomit 03/10/16  Yes Melene Plan, DO  oxyCODONE-acetaminophen (PERCOCET/ROXICET) 5-325 MG tablet  07/10/18  Yes [provider]  pantoprazole (PROTONIX) 40 MG tablet Take 40 mg by mouth daily.   Yes [provider]  Polyethylene Glycol 3350 (PEG 3350) POWD Take by mouth.   Yes [provider]  rivaroxaban (XARELTO) 20 MG TABS tablet Take by mouth. 06/08/18 06/08/19 Yes [provider]  rosuvastatin (CRESTOR) 5 MG tablet Take 5 mg by mouth daily.   Yes [provider]  senna-docusate (SENOKOT-S) 8.6-50 MG tablet Take by mouth.   Yes [provider]  traMADol Janean Sark) 50 MG tablet  06/05/18  Yes [provider]     No family history on file.  Social History   Socioeconomic History  . Marital status: Married    Spouse name: Not on file  . Number of children: Not on file  . Years of education: Not on file  . Highest education level: Not on file  Occupational History  . Not on file  Social Needs  . Financial resource strain: Not on file  . Food insecurity:    Worry: Not on file    Inability: Not on file  . Transportation needs:    Medical: Not on file    Non-medical: Not on file  Tobacco Use  . Smoking status: Former Games developer  . Smokeless tobacco: Never Used  Substance and Sexual Activity  . Alcohol use: No  . Drug use: Never  . Sexual activity: Not on file  Lifestyle  . Physical activity:    Days per week: Not on file    Minutes per session: Not on file  . Stress: Not on file  Relationships  . Social connections:    Talks on phone: Not on file    Gets  together: Not on file    Attends religious service: Not on file    Active member of club or organization: Not on file    Attends meetings of clubs or organizations: Not on file    Relationship status: Not on file  Other Topics Concern  . Not on file  Social History Narrative  . Not on file    ECOG Status: 2 - Symptomatic, <50% confined to bed  Review of Systems: A 12 point ROS discussed and pertinent positives are indicated in the HPI above.  All other systems are negative.  Review of Systems  Vital Signs: BP (!) 170/92   Pulse 76  Temp 97.9 F (36.6 C) (Oral)   Resp 15   Ht 6\' 2"  (1.88 m)   Wt 77.6 kg   SpO2 97%   BMI 21.96 kg/m   Physical Exam Constitutional: Oriented to person, place, and time. Well-developed and well-nourished. No distress.  Last Weight  Most recent update: 07/31/2018 11:09 AM   Weight  77.6 kg (171 lb)           HENT:  Head: Normocephalic and atraumatic.  Eyes: Conjunctivae and EOM are normal. Right eye exhibits no discharge. Left eye exhibits no discharge. No scleral icterus.  Neck: No JVD present.  Pulmonary/Chest: Effort normal. No stridor. No respiratory distress.  Mild thoracic kyphosis.  Abdomen: soft, non distended Neurological:  alert and oriented to person, place, and time.  Ambulates with an antalgic gait.  Skin: Skin is warm and dry.  not diaphoretic.  Psychiatric:   normal mood and affect.   behavior is normal. Judgment and thought content normal.  Mallampati Score:     Imaging: MRI LUMBAR SPINE WITHOUT CONTRAST  TECHNIQUE: Multiplanar, multisequence MR imaging of the lumbar spine was performed. No intravenous contrast was administered.  COMPARISON: CT Abdomen and Pelvis 04/28/2018.  FINDINGS: Segmentation: Normal on the comparison CT.  Alignment: Stable mild levoconvex lumbar scoliosis and straightening of lumbar lordosis.  Vertebrae: Compression fracture of L1 is new since the November CT with superior endplate  deformity, moderate vertebral marrow edema and about 25% loss of vertebral body height. Minimal retropulsion of the L1 posterosuperior endplate. There is marrow edema in the right L1 pedicle. The other L1 posterior elements appear intact.  Also new L3 superior endplate compression fracture since November with mild to moderate marrow edema and 33% loss of L3 vertebral body height. Minimal retropulsion. Mild marrow edema in the right L3 pedicle.  Chronic L4 compression fracture.  Chronic T12 compression fracture although there is perhaps trace marrow edema along the T12 superior endplate.  Background bone marrow signal is within normal limits. Intact visible sacrum and SI joints.  Conus medullaris and cauda equina: Conus extends to the T12-L1 level. No lower spinal cord or conus signal abnormality.  Paraspinal and other soft tissues: Negative visible abdominal viscera.  There is mild edema in the medial right psoas muscle spanning the L1 through L3 levels best seen on series 4, image 1. Associated bilateral erector spinae muscle edema, greater on the right.  Disc levels:  Lumbar spine degeneration with no high-grade spinal stenosis. No significant stenosis associated with the mild bony retropulsion.  At L3-L4 there is mild to moderate spinal and right lateral recess stenosis.  At L4-L5 there is moderate to severe left L4 foraminal stenosis with suspected left foraminal disc extrusion on series 5, image 34. Foraminal disc fragment is estimated at 10-12 millimeters.  At L5-S1 there is moderate to severe multifactorial left L5 foraminal stenosis which appears stable since November.  IMPRESSION: 1. Acute to subacute L1 and L3 compression fractures. 25-33% loss of vertebral body height with mild retropulsion of bone and no other complicating features. 2. Edema in the medial right psoas and bilateral erector spinae muscles could be posttraumatic or related to the abnormal  lumbar biomechanics. 3. Suspicion of a degenerative left foraminal disc extrusion at L4-L5 (series 5, image 34). Query left L4 radiculitis.   Electronically Signed By: Odessa Fleming M.D. On: 07/08/2018 20:55   CT ABDOMEN AND PELVIS WITH CONTRAST  TECHNIQUE: Multidetector CT imaging of the abdomen and pelvis was performed using the standard protocol  following bolus administration of intravenous contrast.  CONTRAST: 100 mL Omnipaque 350 intravenous contrast.  COMPARISON: CT abdomen pelvis dated August 22, 2017.  FINDINGS: Lower chest: Bibasilar atelectasis. Stable bronchiectasis and scarring in the left lower lobe. Partially visualized bilateral lower lobe pulmonary emboli.  Hepatobiliary: No focal liver abnormality is seen. Status post cholecystectomy. No biliary dilatation.  Pancreas: Unremarkable. No pancreatic ductal dilatation or surrounding inflammatory changes.  Spleen: Normal in size without focal abnormality.  Adrenals/Urinary Tract: The adrenal glands are unremarkable. Unchanged bilateral renal cysts. Unchanged punctate calculus in the midpole of the left kidney. No hydronephrosis. The bladder is unremarkable.  Stomach/Bowel: Stomach is within normal limits. Appendix appears normal. No evidence of bowel wall thickening, distention, or inflammatory changes. Moderate sigmoid diverticulosis.  Vascular/Lymphatic: Aortic atherosclerosis. No enlarged abdominal or pelvic lymph nodes.  Reproductive: Stable prostatomegaly.  Other: Unchanged small fat containing left inguinal hernia. No free fluid or pneumoperitoneum.  Musculoskeletal: Unchanged acute to subacute appearing T10 compression fracture, new since August. Unchanged chronic L4 superior endplate compression deformity.  IMPRESSION: 1. No acute intra-abdominal process. No findings concerning for malignancy. 2. Unchanged acute to subacute appearing T10 compression fracture. 3. Unchanged nonobstructive left  nephrolithiasis. 4. Partially visualized bilateral lower lobe pulmonary emboli.   Electronically Signed By: Obie DredgeWilliam T Derry M.D. On: 04/28/2018 14:55    Labs:  CBC: Recent Labs    02/04/18 0045  WBC 5.2  HGB 13.5  HCT 39.3  PLT 179    COAGS: No results for input(s): INR, APTT in the last 8760 hours.  BMP: Recent Labs    02/04/18 0045  NA 141  K 3.9  CL 106  CO2 28  GLUCOSE 121*  BUN 20  CALCIUM 9.1  CREATININE 1.05  GFRNONAA >60  GFRAA >60    LIVER FUNCTION TESTS: Recent Labs    02/04/18 0045  BILITOT 0.7  AST 28  ALT 16  ALKPHOS 55  PROT 7.1  ALBUMIN 4.1    TUMOR MARKERS: No results for input(s): AFPTM, CEA, CA199, CHROMGRNA in the last 8760 hours.  Assessment and Plan: My impression is that this patient has subacute T11, L1 and L3 compression fracture deformities which likely contribute or account for most of his low back pain.  These would be anatomically approachable for percutaneous intervention.  No associated spinal stenosis or other contraindications.  No suggestion of metastatic disease or other pathologic findings to indicate a need for concomitant core biopsy. There are associated lumbar spondylitic changes which may account for the right lower extremity symptomatology.   Given his lack of adequate symptom relief with time and a fairly aggressive pain medication regimen, and his limitations of activities of daily living, he is clinically an appropriate candidate for consideration of vertebral augmentation. I discussed with the patient and his spouse the pathophysiology of vertebral compression fracture deformities; the stable nature of these which does not require emergent treatment; natural history which includes healing over some unpredictable number of months.  We discussed treatment options including watchful waiting, surgical fixation, and percutaneous kyphoplasty/vertebroplasty.  We discussed in detail the percutaneous kyphoplasty technique,  anticipated benefits, time course to symptom resolution, possible risks and side effects.  We discussed his elevated risk of additional level fractures with or without vertebral augmentation.  We discussed the long-term need for continued bone building therapy managed by his PCP.  We discussed that his right lower extremity symptoms if secondary to spondylitic change may not be limited by vertebral augmentation, but there are alternative treatments available  if this pain persists after treatment of the fractures. They seemed to understand, and did ask appropriate questions. He is motivated to proceed with treatment ASAP.  Accordingly, we can set him up as an outpatient for percutaneous T11, L1, L3 kyphoplasty under moderate sedation.  Thank you for this interesting consult.  I greatly enjoyed meeting Steven Doyle and look forward to participating in their care.  A copy of this report was sent to the requesting provider on this date.  Electronically Signed: Durwin Glaze 07/31/2018, 3:28 PM   I spent a total of  40 Minutes   in face to face in clinical consultation, greater than 50% of which was counseling/coordinating care for thoracic and lumbar subacute painful compression fractures.

## 2018-08-01 ENCOUNTER — Other Ambulatory Visit (HOSPITAL_COMMUNITY): Payer: Self-pay | Admitting: Interventional Radiology

## 2018-08-01 DIAGNOSIS — S32030A Wedge compression fracture of third lumbar vertebra, initial encounter for closed fracture: Secondary | ICD-10-CM

## 2018-08-01 DIAGNOSIS — S32010A Wedge compression fracture of first lumbar vertebra, initial encounter for closed fracture: Secondary | ICD-10-CM

## 2018-08-01 DIAGNOSIS — S22080A Wedge compression fracture of T11-T12 vertebra, initial encounter for closed fracture: Secondary | ICD-10-CM

## 2018-08-04 DIAGNOSIS — F411 Generalized anxiety disorder: Secondary | ICD-10-CM | POA: Insufficient documentation

## 2018-08-04 DIAGNOSIS — F320 Major depressive disorder, single episode, mild: Secondary | ICD-10-CM | POA: Insufficient documentation

## 2018-08-04 HISTORY — DX: Generalized anxiety disorder: F41.1

## 2018-08-04 HISTORY — DX: Major depressive disorder, single episode, mild: F32.0

## 2018-08-05 ENCOUNTER — Other Ambulatory Visit: Payer: Self-pay | Admitting: Physician Assistant

## 2018-08-06 ENCOUNTER — Ambulatory Visit (HOSPITAL_COMMUNITY)
Admission: RE | Admit: 2018-08-06 | Discharge: 2018-08-06 | Disposition: A | Discharge status: Home / Self Care | Payer: Medicare HMO | Source: Ambulatory Visit | Attending: Interventional Radiology | Admitting: Interventional Radiology

## 2018-08-06 ENCOUNTER — Other Ambulatory Visit: Payer: Self-pay

## 2018-08-06 ENCOUNTER — Other Ambulatory Visit (HOSPITAL_COMMUNITY): Payer: Self-pay | Admitting: Interventional Radiology

## 2018-08-06 ENCOUNTER — Encounter (HOSPITAL_COMMUNITY): Payer: Self-pay

## 2018-08-06 DIAGNOSIS — S32030A Wedge compression fracture of third lumbar vertebra, initial encounter for closed fracture: Secondary | ICD-10-CM

## 2018-08-06 DIAGNOSIS — I2510 Atherosclerotic heart disease of native coronary artery without angina pectoris: Secondary | ICD-10-CM | POA: Diagnosis not present

## 2018-08-06 DIAGNOSIS — X58XXXA Exposure to other specified factors, initial encounter: Secondary | ICD-10-CM | POA: Insufficient documentation

## 2018-08-06 DIAGNOSIS — S32010A Wedge compression fracture of first lumbar vertebra, initial encounter for closed fracture: Secondary | ICD-10-CM | POA: Diagnosis not present

## 2018-08-06 DIAGNOSIS — Z87891 Personal history of nicotine dependence: Secondary | ICD-10-CM | POA: Diagnosis not present

## 2018-08-06 DIAGNOSIS — Z7901 Long term (current) use of anticoagulants: Secondary | ICD-10-CM | POA: Diagnosis not present

## 2018-08-06 DIAGNOSIS — S22080A Wedge compression fracture of T11-T12 vertebra, initial encounter for closed fracture: Secondary | ICD-10-CM | POA: Diagnosis present

## 2018-08-06 DIAGNOSIS — Z79899 Other long term (current) drug therapy: Secondary | ICD-10-CM | POA: Insufficient documentation

## 2018-08-06 DIAGNOSIS — Z951 Presence of aortocoronary bypass graft: Secondary | ICD-10-CM | POA: Insufficient documentation

## 2018-08-06 DIAGNOSIS — Z888 Allergy status to other drugs, medicaments and biological substances status: Secondary | ICD-10-CM | POA: Insufficient documentation

## 2018-08-06 HISTORY — PX: IR KYPHO EA ADDL LEVEL THORACIC OR LUMBAR: IMG5520

## 2018-08-06 HISTORY — PX: IR KYPHO LUMBAR INC FX REDUCE BONE BX UNI/BIL CANNULATION INC/IMAGING: IMG5519

## 2018-08-06 LAB — CBC
HCT: 40.9 % (ref 39.0–52.0)
Hemoglobin: 13.3 g/dL (ref 13.0–17.0)
MCH: 29 pg (ref 26.0–34.0)
MCHC: 32.5 g/dL (ref 30.0–36.0)
MCV: 89.1 fL (ref 80.0–100.0)
PLATELETS: 211 10*3/uL (ref 150–400)
RBC: 4.59 MIL/uL (ref 4.22–5.81)
RDW: 14 % (ref 11.5–15.5)
WBC: 4.2 10*3/uL (ref 4.0–10.5)
nRBC: 0 % (ref 0.0–0.2)

## 2018-08-06 LAB — PROTIME-INR
INR: 1.04
PROTHROMBIN TIME: 13.5 s (ref 11.4–15.2)

## 2018-08-06 LAB — APTT: APTT: 32 s (ref 24–36)

## 2018-08-06 MED ORDER — LIDOCAINE HCL (PF) 1 % IJ SOLN
INTRAMUSCULAR | Status: AC
  Filled 2018-08-06: qty 30

## 2018-08-06 MED ORDER — MIDAZOLAM HCL 2 MG/2ML IJ SOLN
INTRAMUSCULAR | Status: AC | PRN
  Administered 2018-08-06 (×3): 0.5 mg via INTRAVENOUS
  Administered 2018-08-06: 1 mg via INTRAVENOUS
  Administered 2018-08-06: 0.5 mg via INTRAVENOUS

## 2018-08-06 MED ORDER — CEFAZOLIN (ANCEF) 1 G IV SOLR
INTRAVENOUS | Status: AC | PRN
  Administered 2018-08-06: 2 g

## 2018-08-06 MED ORDER — IOPAMIDOL (ISOVUE-300) INJECTION 61%
INTRAVENOUS | Status: AC
  Administered 2018-08-06: 20 mL
  Filled 2018-08-06: qty 50

## 2018-08-06 MED ORDER — FENTANYL CITRATE (PF) 100 MCG/2ML IJ SOLN
INTRAMUSCULAR | Status: AC
  Filled 2018-08-06: qty 2

## 2018-08-06 MED ORDER — LIDOCAINE HCL 1 % IJ SOLN
INTRAMUSCULAR | Status: AC | PRN
  Administered 2018-08-06 (×2): 20 mL

## 2018-08-06 MED ORDER — MIDAZOLAM HCL 2 MG/2ML IJ SOLN
INTRAMUSCULAR | Status: AC
  Filled 2018-08-06: qty 2

## 2018-08-06 MED ORDER — CEFAZOLIN SODIUM-DEXTROSE 2-4 GM/100ML-% IV SOLN: 2.0000 g | Freq: Once | INTRAVENOUS | Status: DC

## 2018-08-06 MED ORDER — IOPAMIDOL (ISOVUE-300) INJECTION 61%
INTRAVENOUS | Status: AC
  Administered 2018-08-06: 25 mL
  Filled 2018-08-06: qty 50

## 2018-08-06 MED ORDER — SODIUM CHLORIDE 0.9 % IV SOLN
INTRAVENOUS | Status: DC
  Administered 2018-08-06: 09:00:00 via INTRAVENOUS

## 2018-08-06 MED ORDER — CEFAZOLIN SODIUM-DEXTROSE 2-4 GM/100ML-% IV SOLN
INTRAVENOUS | Status: AC
  Filled 2018-08-06: qty 100

## 2018-08-06 MED ORDER — FENTANYL CITRATE (PF) 100 MCG/2ML IJ SOLN
INTRAMUSCULAR | Status: AC | PRN
  Administered 2018-08-06 (×3): 25 ug via INTRAVENOUS
  Administered 2018-08-06: 50 ug via INTRAVENOUS
  Administered 2018-08-06: 25 ug via INTRAVENOUS

## 2018-08-06 NOTE — Progress Notes (Signed)
HOB  Up 45 degrees, eating lunch without difficulty.

## 2018-08-06 NOTE — Discharge Instructions (Addendum)
Moderate Conscious Sedation, Adult, Care After  These instructions provide you with information about caring for yourself after your procedure. Your health care provider may also give you more specific instructions. Your treatment has been planned according to current medical practices, but problems sometimes occur. Call your health care provider if you have any problems or questions after your procedure.  What can I expect after the procedure?  After your procedure, it is common:   To feel sleepy for several hours.   To feel clumsy and have poor balance for several hours.   To have poor judgment for several hours.   To vomit if you eat too soon.  Follow these instructions at home:  For at least 24 hours after the procedure:     Do not:  ? Participate in activities where you could fall or become injured.  ? Drive.  ? Use heavy machinery.  ? Drink alcohol.  ? Take sleeping pills or medicines that cause drowsiness.  ? Make important decisions or sign legal documents.  ? Take care of children on your own.   Rest.  Eating and drinking   Follow the diet recommended by your health care provider.   If you vomit:  ? Drink water, juice, or soup when you can drink without vomiting.  ? Make sure you have little or no nausea before eating solid foods.  General instructions   Have a responsible adult stay with you until you are awake and alert.   Take over-the-counter and prescription medicines only as told by your health care provider.   If you smoke, do not smoke without supervision.   Keep all follow-up visits as told by your health care provider. This is important.  Contact a health care provider if:   You keep feeling nauseous or you keep vomiting.   You feel light-headed.   You develop a rash.   You have a fever.  Get help right away if:   You have trouble breathing.  This information is not intended to replace advice given to you by your health care provider. Make sure you discuss any questions you have  with your health care provider.  Document Released: 04/02/2013 Document Revised: 11/15/2015 Document Reviewed: 10/02/2015  Elsevier Interactive Patient Education  2019 [employer]  Balloon Kyphoplasty, Care After  Refer to this sheet in the next few weeks. These instructions provide you with information about caring for yourself after your procedure. Your health care provider may also give you more specific instructions. Your treatment has been planned according to current medical practices, but problems sometimes occur. Call your health care provider if you have any problems or questions after your procedure.  What can I expect after the procedure?  After your procedure, it is common to have back pain.  Follow these instructions at home:  Incision care   Follow instructions from your health care provider about how to take care of your incisions. Make sure you:  ? Wash your hands with soap and water before you change your bandage (dressing). If soap and water are not available, use hand sanitizer.  ? Change your dressing as told by your health care provider.  ? Leave stitches (sutures), skin glue, or adhesive strips in place. These skin closures may need to be in place for 2 weeks or longer. If adhesive strip edges start to loosen and curl up, you may trim the loose edges. Do not remove adhesive strips completely unless your health care provider tells you to   do that.   Check your incision area every day for signs of infection. Watch for:  ? Redness, swelling, or pain.  ? Fluid, blood, or pus.   Keep your dressing dry until your health care provider says that it can be removed.  Activity     Rest your back and avoid intense physical activity for as long as told by your health care provider.   Return to your normal activities as told by your health care provider. Ask your health care provider what activities are safe for you.   Do not lift anything that is heavier than 10 lb (4.5 kg). This is about the weight  of a gallon of milk.You may need to avoid heavy lifting for several weeks.  General instructions   Take over-the-counter and prescription medicines only as told by your health care provider.   If directed, apply ice to the painful area:  ? Put ice in a plastic bag.  ? Place a towel between your skin and the bag.  ? Leave the ice on for 20 minutes, 2-3 times per day.   Do not use tobacco products, including cigarettes, chewing tobacco, or e-cigarettes. If you need help quitting, ask your health care provider.   Keep all follow-up visits as told by your health care provider. This is important.  Contact a health care provider if:   You have a fever.   You have redness, swelling, or pain at the site of your incisions.   You have fluid, blood, or pus coming from your incisions.   You have pain that gets worse or does not get better with medicine.   You develop numbness or weakness in any part of your body.  Get help right away if:   You have chest pain.   You have difficulty breathing.   You cannot move your legs.   You cannot control your bladder or bowel movements.   You suddenly become weak or numb on one side of your body.   You become very confused.   You have trouble speaking or understanding, or both.  This information is not intended to replace advice given to you by your health care provider. Make sure you discuss any questions you have with your health care provider.  Document Released: 03/03/2015 Document Revised: 11/18/2015 Document Reviewed: 10/05/2014  Elsevier Interactive Patient Education  2019 [employer]

## 2018-08-06 NOTE — Sedation Documentation (Signed)
In IR Room 1, positioned on the table

## 2018-08-06 NOTE — H&P (Signed)
Chief Complaint: Patient was seen in consultation today for Thoracic 11 and Lumbar 1 and 3 Kyphoplasty at the request Dr Jim Desanctis Bradford  Supervising Physician: Oley BalmHassell, Daniel  Patient Status: [facility] - Out[facility]  History of Present Illness: Steven Doyle is a 85 y.o. male   Injured back lifting equipment for camping Sept 2019 Back semi resolved--- But new low back pain has been apparent more recently Medications are without relief  CT 04/2018:  IMPRESSION: 1. Acute to subacute L1 and L3 compression fractures. 25-33% loss of vertebral body height with mild retropulsion of bone and no other complicating features. 2. Edema in the medial right psoas and bilateral erector spinae muscles could be posttraumatic or related to the abnormal lumbar biomechanics. 3. Suspicion of a degenerative left foraminal disc extrusion at L4-L5 (series 5, image 34). Query left L4 radiculitis.   Was referred to Dr Deanne CofferHassell for evaluation and possible treatments  Note Dr Deanne CofferHassell 07/31/18:   Assessment and Plan: My impression is that this patient has subacute T11, L1 and L3 compression fracture deformities which likely contribute or account for most of his low back pain.  These would be anatomically approachable for percutaneous intervention.  No associated spinal stenosis or other contraindications.  No suggestion of metastatic disease or other pathologic findings to indicate a need for concomitant core biopsy. There are associated lumbar spondylitic changes which may account for the right lower extremity symptomatology.   Given his lack of adequate symptom relief with time and a fairly aggressive pain medication regimen, and his limitations of activities of daily living, he is clinically an appropriate candidate for consideration of vertebral augmentation.  Scheduled now for T11; L1 and 3 KP LD Xarelto 08/04/18  Past Medical History:  Diagnosis Date  . Coronary artery disease   . Diverticulitis     Past  Surgical History:  Procedure Laterality Date  . CORONARY ARTERY BYPASS GRAFT    . HYDROCELE EXCISION / REPAIR    . IR RADIOLOGIST EVAL & MGMT  07/31/2018  . PROSTATE SURGERY    . ROTATOR CUFF REPAIR      Allergies: Atorvastatin  Medications: Prior to Admission medications   Medication Sig Start Date End Date Taking? Authorizing Provider  baclofen (LIORESAL) 10 MG tablet Take 10 mg by mouth 3 (three) times daily.   Yes [provider]  Calcium Carbonate-Vitamin D (CALCIUM-VITAMIN D) 600-125 MG-UNIT TABS Take 1 tablet by mouth 2 (two) times daily.   Yes [provider]  Cholecalciferol (VITAMIN D) 50 MCG (2000 UT) CAPS Take 2,000 Units by mouth daily.   Yes [provider]  diphenhydrAMINE (BENADRYL) 25 MG tablet Take 25 mg by mouth at bedtime.    Yes [provider]  fluticasone (FLONASE) 50 MCG/ACT nasal spray Place 2 sprays into both nostrils daily as needed for allergies.    Yes [provider]  magnesium hydroxide (MILK OF MAGNESIA) 400 MG/5ML suspension Take 30 mLs by mouth 2 (two) times daily as needed for mild constipation or moderate constipation.   Yes [provider]  Multiple Vitamin (MULTIVITAMIN) tablet Take 1 tablet by mouth daily.    Yes [provider]  Omega-3 1000 MG CAPS Take 1,000 mg by mouth daily.    Yes [provider]  oxyCODONE-acetaminophen (PERCOCET/ROXICET) 5-325 MG tablet Take 1 tablet by mouth every 4 (four) hours as needed for severe pain.  07/10/18  Yes [provider]  pantoprazole (PROTONIX) 40 MG tablet Take 40 mg by mouth daily.  Yes [provider]  Polyethylene Glycol 3350 (PEG 3350) POWD Take 34 g by mouth daily.    Yes [provider]  rosuvastatin (CRESTOR) 5 MG tablet Take 5 mg by mouth daily at 6 PM.    Yes [provider]  senna-docusate (SENOKOT-S) 8.6-50 MG tablet Take 2-3 tablets by mouth 2 (two) times daily as needed for moderate  constipation.    Yes [provider]  traMADol (ULTRAM) 50 MG tablet Take 50 mg by mouth every 6 (six) hours as needed for moderate pain.  06/05/18  Yes [provider]  rivaroxaban (XARELTO) 20 MG TABS tablet Take 20 mg by mouth daily with supper.  06/08/18 06/08/19  [provider]     History reviewed. No pertinent family history.  Social History   Socioeconomic History  . Marital status: Married    Spouse name: Not on file  . Number of children: Not on file  . Years of education: Not on file  . Highest education level: Not on file  Occupational History  . Not on file  Social Needs  . Financial resource strain: Not on file  . Food insecurity:    Worry: Not on file    Inability: Not on file  . Transportation needs:    Medical: Not on file    Non-medical: Not on file  Tobacco Use  . Smoking status: Former Games developer  . Smokeless tobacco: Never Used  Substance and Sexual Activity  . Alcohol use: No  . Drug use: Never  . Sexual activity: Not on file  Lifestyle  . Physical activity:    Days per week: Not on file    Minutes per session: Not on file  . Stress: Not on file  Relationships  . Social connections:    Talks on phone: Not on file    Gets together: Not on file    Attends religious service: Not on file    Active member of club or organization: Not on file    Attends meetings of clubs or organizations: Not on file    Relationship status: Not on file  Other Topics Concern  . Not on file  Social History Narrative  . Not on file    Review of Systems: A 12 point ROS discussed and pertinent positives are indicated in the HPI above.  All other systems are negative.  Review of Systems  Constitutional: Positive for activity change. Negative for appetite change, fatigue and fever.  Respiratory: Negative for cough and shortness of breath.   Cardiovascular: Negative for chest pain.  Gastrointestinal: Negative for abdominal pain.    Musculoskeletal: Positive for back pain and gait problem.  Neurological: Positive for weakness.  Psychiatric/Behavioral: Negative for behavioral problems and confusion.    Vital Signs: BP (!) 166/92   Pulse (!) 57   Temp 97.9 F (36.6 C) (Oral)   Resp 18   Ht 6\' 2"  (1.88 m)   Wt 168 lb (76.2 kg)   SpO2 97%   BMI 21.57 kg/m   Physical Exam Cardiovascular:     Rate and Rhythm: Normal rate and regular rhythm.     Heart sounds: Normal heart sounds.  Pulmonary:     Effort: Pulmonary effort is normal.     Breath sounds: Normal breath sounds.  Abdominal:     General: Bowel sounds are normal.     Palpations: Abdomen is soft.  Musculoskeletal: Normal range of motion.     Comments: Low back pain  Skin:  General: Skin is warm and dry.  Neurological:     Mental Status: He is alert and oriented to person, place, and time.  Psychiatric:        Mood and Affect: Mood normal.        Behavior: Behavior normal.        Thought Content: Thought content normal.        Judgment: Judgment normal.     Imaging: Ir Radiologist Eval & Mgmt  Result Date: 07/31/2018 Please refer to notes tab for details about interventional procedure. (Op Note)   Labs:  CBC: Recent Labs    02/04/18 0045 08/06/18 0703  WBC 5.2 4.2  HGB 13.5 13.3  HCT 39.3 40.9  PLT 179 211    COAGS: Recent Labs    08/06/18 0703  INR 1.04  APTT 32    BMP: Recent Labs    02/04/18 0045  NA 141  K 3.9  CL 106  CO2 28  GLUCOSE 121*  BUN 20  CALCIUM 9.1  CREATININE 1.05  GFRNONAA >60  GFRAA >60    LIVER FUNCTION TESTS: Recent Labs    02/04/18 0045  BILITOT 0.7  AST 28  ALT 16  ALKPHOS 55  PROT 7.1  ALBUMIN 4.1    TUMOR MARKERS: No results for input(s): AFPTM, CEA, CA199, CHROMGRNA in the last 8760 hours.  Assessment and Plan:  New acute spinal painful fractures Consult with Dr Deanne Coffer Now scheduled for T 11 and L 1and 3 Kyphoplasty Risks and benefits of T11; L1 and 3 Kyphoplasty  were discussed with the patient including, but not limited to education regarding the natural healing process of compression fractures without intervention, bleeding, infection, cement migration which may cause spinal cord damage, paralysis, pulmonary embolism or even death.  This interventional procedure involves the use of X-rays and because of the nature of the planned procedure, it is possible that we will have prolonged use of X-ray fluoroscopy.  Potential radiation risks to you include (but are not limited to) the following: - A slightly elevated risk for cancer  several years later in life. This risk is typically less than 0.5% percent. This risk is low in comparison to the normal incidence of human cancer, which is 33% for women and 50% for men according to the [employer] Society. - Radiation induced injury can include skin redness, resembling a rash, tissue breakdown / ulcers and hair loss (which can be temporary or permanent).   The likelihood of either of these occurring depends on the difficulty of the procedure and whether you are sensitive to radiation due to previous procedures, disease, or genetic conditions.   IF your procedure requires a prolonged use of radiation, you will be notified and given written instructions for further action.  It is your responsibility to monitor the irradiated area for the 2 weeks following the procedure and to notify your physician if you are concerned that you have suffered a radiation induced injury.    All of the patient's questions were answered, patient is agreeable to proceed.  Consent signed and in chart.  Thank you for this interesting consult.  I greatly enjoyed meeting Steven Doyle and look forward to participating in their care.  A copy of this report was sent to the requesting provider on this date.  Electronically Signed: Robet Leu, PA-C 08/06/2018, 8:55 AM   I spent a total of  30 Minutes   in face to face in clinical  consultation, greater than 50% of  which was counseling/coordinating care for Thoracic 11 and Lumbar 1 and 3 Kyphoplasty

## 2018-08-06 NOTE — Procedures (Signed)
  Procedure: T12 L1 L3 kyphoplasty   EBL:   minimal Complications:  none immediate  See full dictation in [employer].  Thora Lance MD Main # [phone] Pager  [phone]

## 2018-08-13 ENCOUNTER — Telehealth: Payer: Self-pay | Admitting: Internal Medicine

## 2018-08-16 NOTE — Telephone Encounter (Signed)
Unfortunately, my practice cannot accommodate his request for a second opinion at this time.  Thank you

## 2018-08-19 NOTE — Telephone Encounter (Signed)
Dr. Meridee Score DOD 2.28.20.  GI hx at [facility].  Pt requested a second opinion for EGD and colonoscopy.  Pt reported nausea and 30-lb weight loss.  Pt's wife reported that she is legally blind and cannot drive to [place] or [facility].  Records will be put on your desk for review.   Will you accept this pt?

## 2018-08-20 ENCOUNTER — Other Ambulatory Visit: Payer: Self-pay | Admitting: Interventional Radiology

## 2018-08-20 DIAGNOSIS — S22000A Wedge compression fracture of unspecified thoracic vertebra, initial encounter for closed fracture: Secondary | ICD-10-CM

## 2018-08-20 DIAGNOSIS — S32000A Wedge compression fracture of unspecified lumbar vertebra, initial encounter for closed fracture: Secondary | ICD-10-CM

## 2018-08-20 NOTE — Telephone Encounter (Signed)
I will not be coming by the office this week as I am the hospital MD. When I return to the office by the middle of next week, I will review the chart. If he needs an earlier answer, then please send this to a provider that is around to review his chart this week. Thanks. GM

## 2018-08-28 ENCOUNTER — Other Ambulatory Visit: Payer: Self-pay | Admitting: Interventional Radiology

## 2018-08-28 ENCOUNTER — Ambulatory Visit
Admission: RE | Admit: 2018-08-28 | Discharge: 2018-08-28 | Disposition: A | Discharge status: Home / Self Care | Payer: Medicare HMO | Source: Ambulatory Visit | Attending: Interventional Radiology | Admitting: Interventional Radiology

## 2018-08-28 ENCOUNTER — Encounter: Payer: Self-pay | Admitting: Radiology

## 2018-08-28 DIAGNOSIS — S22000A Wedge compression fracture of unspecified thoracic vertebra, initial encounter for closed fracture: Secondary | ICD-10-CM

## 2018-08-28 DIAGNOSIS — S32000A Wedge compression fracture of unspecified lumbar vertebra, initial encounter for closed fracture: Secondary | ICD-10-CM

## 2018-08-28 HISTORY — PX: IR RADIOLOGIST EVAL & MGMT: IMG5224

## 2018-08-28 NOTE — Progress Notes (Signed)
Referring Physician(s): Dr Jim Desanctis  Chief Complaint: The patient is seen in follow up today s/p Kyphoplasty T11; L1; L3 08/06/18  History of present illness:  Did have some pain relief for few days But just days after procedure-- noted new pain further down spine Pain is mildly relieved with Tylenol 500 mg - 2 po 3x/day Pain is constant; cannot stand long and cannot take larger steps without pain Wife says "he shuffles" Can sleep if takes meds before bed Denies injury Denies fever/chills  Scheduled today for follow up post 3 level KP 08/06/18  Past Medical History:  Diagnosis Date  . Coronary artery disease   . Diverticulitis     Past Surgical History:  Procedure Laterality Date  . CORONARY ARTERY BYPASS GRAFT    . HYDROCELE EXCISION / REPAIR    . IR KYPHO EA ADDL LEVEL THORACIC OR LUMBAR  08/06/2018  . IR KYPHO EA ADDL LEVEL THORACIC OR LUMBAR  08/06/2018  . IR KYPHO LUMBAR INC FX REDUCE BONE BX UNI/BIL CANNULATION INC/IMAGING  08/06/2018  . IR RADIOLOGIST EVAL & MGMT  07/31/2018  . IR RADIOLOGIST EVAL & MGMT  08/28/2018  . PROSTATE SURGERY    . ROTATOR CUFF REPAIR      Allergies: Atorvastatin  Medications: Prior to Admission medications   Medication Sig Start Date End Date Taking? Authorizing Provider  acetaminophen (TYLENOL) 500 MG tablet Take 1,000 mg by mouth 3 (three) times daily as needed.   Yes [provider]  baclofen (LIORESAL) 10 MG tablet Take 10 mg by mouth 3 (three) times daily.   Yes [provider]  Calcium Carbonate-Vitamin D (CALCIUM-VITAMIN D) 600-125 MG-UNIT TABS Take 1 tablet by mouth 2 (two) times daily.   Yes [provider]  Cholecalciferol (VITAMIN D) 50 MCG (2000 UT) CAPS Take 2,000 Units by mouth daily.   Yes [provider]  diphenhydrAMINE (BENADRYL) 25 MG tablet Take 25 mg by mouth at bedtime.    Yes [provider]  magnesium hydroxide (MILK OF MAGNESIA) 400 MG/5ML suspension Take 30 mLs by  mouth 2 (two) times daily as needed for mild constipation or moderate constipation.   Yes [provider]  Multiple Vitamin (MULTIVITAMIN) tablet Take 1 tablet by mouth daily.    Yes [provider]  Omega-3 1000 MG CAPS Take 1,000 mg by mouth daily.    Yes [provider]  pantoprazole (PROTONIX) 40 MG tablet Take 40 mg by mouth daily.   Yes [provider]  Polyethylene Glycol 3350 (PEG 3350) POWD Take 34 g by mouth daily.    Yes [provider]  rivaroxaban (XARELTO) 20 MG TABS tablet Take 20 mg by mouth daily with supper.  06/08/18 06/08/19 Yes [provider]  rosuvastatin (CRESTOR) 5 MG tablet Take 5 mg by mouth daily at 6 PM.    Yes [provider]  senna-docusate (SENOKOT-S) 8.6-50 MG tablet Take 2-3 tablets by mouth 2 (two) times daily as needed for moderate constipation.    Yes [provider]  fluticasone (FLONASE) 50 MCG/ACT nasal spray Place 2 sprays into both nostrils daily as needed for allergies.     [provider]  oxyCODONE-acetaminophen (PERCOCET/ROXICET) 5-325 MG tablet Take 1 tablet by mouth every 4 (four) hours as needed for severe pain.  07/10/18   [provider]  traMADol (ULTRAM) 50 MG tablet Take 50 mg by mouth every 6 (six) hours as needed for moderate pain.  06/05/18   [provider]  No family history on file.  Social History   Socioeconomic History  . Marital status: Married    Spouse name: Not on file  . Number of children: Not on file  . Years of education: Not on file  . Highest education level: Not on file  Occupational History  . Not on file  Social Needs  . Financial resource strain: Not on file  . Food insecurity:    Worry: Not on file    Inability: Not on file  . Transportation needs:    Medical: Not on file    Non-medical: Not on file  Tobacco Use  . Smoking status: Former Games developer  . Smokeless tobacco: Never Used  Substance and Sexual  Activity  . Alcohol use: No  . Drug use: Never  . Sexual activity: Not on file  Lifestyle  . Physical activity:    Days per week: Not on file    Minutes per session: Not on file  . Stress: Not on file  Relationships  . Social connections:    Talks on phone: Not on file    Gets together: Not on file    Attends religious service: Not on file    Active member of club or organization: Not on file    Attends meetings of clubs or organizations: Not on file    Relationship status: Not on file  Other Topics Concern  . Not on file  Social History Narrative  . Not on file     Vital Signs: BP 140/90   Pulse 62   Temp 97.7 F (36.5 C) (Oral)   Resp 17   Ht 6\' 2"  (1.88 m)   Wt 165 lb (74.8 kg)   SpO2 99%   BMI 21.18 kg/m   Physical Exam Vitals signs reviewed.  Constitutional:      Comments: Dr Deanne Coffer has seen  And evaluated pt  Cardiovascular:     Rate and Rhythm: Normal rate and regular rhythm.     Heart sounds: Normal heart sounds.  Pulmonary:     Effort: Pulmonary effort is normal.     Breath sounds: Normal breath sounds.  Musculoskeletal: Normal range of motion.  Skin:    General: Skin is warm and dry.     Comments: Sites of previous KPs are clean and dry NT no bleeding No sign of infection  Neurological:     Mental Status: He is alert and oriented to person, place, and time.  Psychiatric:        Behavior: Behavior normal.    Imaging: Ir Radiologist Eval & Mgmt  Result Date: 08/28/2018 Please refer to notes tab for details about interventional procedure. (Op Note)   Labs:  CBC: Recent Labs    02/04/18 0045 08/06/18 0703  WBC 5.2 4.2  HGB 13.5 13.3  HCT 39.3 40.9  PLT 179 211    COAGS: Recent Labs    08/06/18 0703  INR 1.04  APTT 32    BMP: Recent Labs    02/04/18 0045  NA 141  K 3.9  CL 106  CO2 28  GLUCOSE 121*  BUN 20  CALCIUM 9.1  CREATININE 1.05  GFRNONAA >60  GFRAA >60    LIVER FUNCTION TESTS: Recent Labs     02/04/18 0045  BILITOT 0.7  AST 28  ALT 16  ALKPHOS 55  PROT 7.1  ALBUMIN 4.1    Assessment:  T11; L1; L3 Kyphoplasty 08/06/18 Good pain relief noted x 2-3 days New pain lower in  spine No fever; chills- no sign of infection at sites Will arrange Lumbar spine MRI w/o contrast asap per Dr Deanne Coffer order Pt and wife agreeable to plan   Signed: Robet Leu, PA-C 08/28/2018, 9:59 AM   Please refer to Dr. Deanne Coffer attestation of this note for management and plan.

## 2018-08-28 NOTE — Progress Notes (Deleted)
Referring Physician(s): Dr Jim Desanctis  Supervising Physician: Oley Balm  Chief Complaint: The patient is seen in follow up today s/p Kyphoplasty T11; L1; L3 08/06/18  History of present illness:  Did have some pain relief for few days But just days after procedure-- noted new pain further down spine Pain is mildly relieved with Tylenol 500 mg - 2 po 3x/day Pain is constant; cannot stand long and cannot take larger steps without pain Wife says "he shuffles" Can sleep if takes meds before bed Denies injury Denies fever/chills  Scheduled today for follow up post 3 level KP 08/06/18   Past Medical History:  Diagnosis Date  . Coronary artery disease   . Diverticulitis     Past Surgical History:  Procedure Laterality Date  . CORONARY ARTERY BYPASS GRAFT    . HYDROCELE EXCISION / REPAIR    . IR KYPHO EA ADDL LEVEL THORACIC OR LUMBAR  08/06/2018  . IR KYPHO EA ADDL LEVEL THORACIC OR LUMBAR  08/06/2018  . IR KYPHO LUMBAR INC FX REDUCE BONE BX UNI/BIL CANNULATION INC/IMAGING  08/06/2018  . IR RADIOLOGIST EVAL & MGMT  07/31/2018  . PROSTATE SURGERY    . ROTATOR CUFF REPAIR      Allergies: Atorvastatin  Medications: Prior to Admission medications   Medication Sig Start Date End Date Taking? Authorizing Provider  acetaminophen (TYLENOL) 500 MG tablet Take 1,000 mg by mouth 3 (three) times daily as needed.   Yes [provider]  baclofen (LIORESAL) 10 MG tablet Take 10 mg by mouth 3 (three) times daily.   Yes [provider]  Calcium Carbonate-Vitamin D (CALCIUM-VITAMIN D) 600-125 MG-UNIT TABS Take 1 tablet by mouth 2 (two) times daily.   Yes [provider]  Cholecalciferol (VITAMIN D) 50 MCG (2000 UT) CAPS Take 2,000 Units by mouth daily.   Yes [provider]  diphenhydrAMINE (BENADRYL) 25 MG tablet Take 25 mg by mouth at bedtime.    Yes [provider]  magnesium hydroxide (MILK OF MAGNESIA) 400 MG/5ML suspension Take 30 mLs by  mouth 2 (two) times daily as needed for mild constipation or moderate constipation.   Yes [provider]  Multiple Vitamin (MULTIVITAMIN) tablet Take 1 tablet by mouth daily.    Yes [provider]  Omega-3 1000 MG CAPS Take 1,000 mg by mouth daily.    Yes [provider]  pantoprazole (PROTONIX) 40 MG tablet Take 40 mg by mouth daily.   Yes [provider]  Polyethylene Glycol 3350 (PEG 3350) POWD Take 34 g by mouth daily.    Yes [provider]  rivaroxaban (XARELTO) 20 MG TABS tablet Take 20 mg by mouth daily with supper.  06/08/18 06/08/19 Yes [provider]  rosuvastatin (CRESTOR) 5 MG tablet Take 5 mg by mouth daily at 6 PM.    Yes [provider]  senna-docusate (SENOKOT-S) 8.6-50 MG tablet Take 2-3 tablets by mouth 2 (two) times daily as needed for moderate constipation.    Yes [provider]  fluticasone (FLONASE) 50 MCG/ACT nasal spray Place 2 sprays into both nostrils daily as needed for allergies.     [provider]  oxyCODONE-acetaminophen (PERCOCET/ROXICET) 5-325 MG tablet Take 1 tablet by mouth every 4 (four) hours as needed for severe pain.  07/10/18   [provider]  traMADol (ULTRAM) 50 MG tablet Take 50 mg by mouth every 6 (six) hours as needed for moderate pain.  06/05/18   [provider]  No family history on file.  Social History   Socioeconomic History  . Marital status: Married    Spouse name: Not on file  . Number of children: Not on file  . Years of education: Not on file  . Highest education level: Not on file  Occupational History  . Not on file  Social Needs  . Financial resource strain: Not on file  . Food insecurity:    Worry: Not on file    Inability: Not on file  . Transportation needs:    Medical: Not on file    Non-medical: Not on file  Tobacco Use  . Smoking status: Former Games developer  . Smokeless tobacco: Never Used  Substance and Sexual  Activity  . Alcohol use: No  . Drug use: Never  . Sexual activity: Not on file  Lifestyle  . Physical activity:    Days per week: Not on file    Minutes per session: Not on file  . Stress: Not on file  Relationships  . Social connections:    Talks on phone: Not on file    Gets together: Not on file    Attends religious service: Not on file    Active member of club or organization: Not on file    Attends meetings of clubs or organizations: Not on file    Relationship status: Not on file  Other Topics Concern  . Not on file  Social History Narrative  . Not on file     Vital Signs: BP 140/90   Pulse 62   Temp 97.7 F (36.5 C) (Oral)   Resp 17   Ht  (1.88 m)   Wt 165 lb (74.8 kg)   SpO2 99%   BMI 21.18 kg/m   Physical Exam Vitals signs reviewed.  Constitutional:      Comments: Dr Deanne Coffer has seen  And evaluated pt  Cardiovascular:     Rate and Rhythm: Normal rate and regular rhythm.     Heart sounds: Normal heart sounds.  Pulmonary:     Effort: Pulmonary effort is normal.     Breath sounds: Normal breath sounds.  Musculoskeletal: Normal range of motion.  Skin:    General: Skin is warm and dry.     Comments: Sites of previous KPs are clean and dry NT no bleeding No sign of infection  Neurological:     Mental Status: He is alert and oriented to person, place, and time.  Psychiatric:        Behavior: Behavior normal.     Imaging: No results found.  Labs:  CBC: Recent Labs    02/04/18 0045 08/06/18 0703  WBC 5.2 4.2  HGB 13.5 13.3  HCT 39.3 40.9  PLT 179 211    COAGS: Recent Labs    08/06/18 0703  INR 1.04  APTT 32    BMP: Recent Labs    02/04/18 0045  NA 141  K 3.9  CL 106  CO2 28  GLUCOSE 121*  BUN 20  CALCIUM 9.1  CREATININE 1.05  GFRNONAA >60  GFRAA >60    LIVER FUNCTION TESTS: Recent Labs    02/04/18 0045  BILITOT 0.7  AST 28  ALT 16  ALKPHOS 55  PROT 7.1  ALBUMIN 4.1    Assessment and Plan:  T11; L1;  L3 Kyphoplasty 08/06/18 Good pain relief noted x 2-3 days New pain lower in spine No fever; chills- no sign of infection at sites Will arrange Lumbar spine MRI w/o  contrast asap per Dr Deanne Coffer order Pt and wife agreeable to plan   Electronically Signed: Robet Leu 08/28/2018, 9:50 AM   I spent a total of 25 Minutes in face to face in clinical consultation, greater than 50% of which was counseling/coordinating care for follow up T11-L1-L3 KP anw new pain evaluation

## 2018-08-29 ENCOUNTER — Other Ambulatory Visit: Payer: Self-pay | Admitting: Interventional Radiology

## 2018-08-29 DIAGNOSIS — S32000A Wedge compression fracture of unspecified lumbar vertebra, initial encounter for closed fracture: Secondary | ICD-10-CM

## 2018-08-29 DIAGNOSIS — S22000A Wedge compression fracture of unspecified thoracic vertebra, initial encounter for closed fracture: Secondary | ICD-10-CM

## 2018-09-03 ENCOUNTER — Ambulatory Visit (HOSPITAL_COMMUNITY)
Admission: RE | Admit: 2018-09-03 | Discharge: 2018-09-03 | Disposition: A | Discharge status: Home / Self Care | Payer: Medicare HMO | Source: Ambulatory Visit | Attending: Interventional Radiology | Admitting: Interventional Radiology

## 2018-09-03 DIAGNOSIS — S22000A Wedge compression fracture of unspecified thoracic vertebra, initial encounter for closed fracture: Secondary | ICD-10-CM | POA: Diagnosis not present

## 2018-09-03 DIAGNOSIS — S32000A Wedge compression fracture of unspecified lumbar vertebra, initial encounter for closed fracture: Secondary | ICD-10-CM | POA: Insufficient documentation

## 2018-09-04 NOTE — Telephone Encounter (Signed)
Spoke to pt's wife--pt is scheduled to see Dr. Caryl Never at [facility] 09/09/18.  Records will be shredded.

## 2018-09-24 ENCOUNTER — Other Ambulatory Visit (HOSPITAL_COMMUNITY): Payer: Self-pay | Admitting: Interventional Radiology

## 2018-09-24 DIAGNOSIS — S32050A Wedge compression fracture of fifth lumbar vertebra, initial encounter for closed fracture: Secondary | ICD-10-CM

## 2018-09-24 DIAGNOSIS — S32020A Wedge compression fracture of second lumbar vertebra, initial encounter for closed fracture: Secondary | ICD-10-CM

## 2018-09-24 DIAGNOSIS — S32040A Wedge compression fracture of fourth lumbar vertebra, initial encounter for closed fracture: Secondary | ICD-10-CM

## 2018-09-30 ENCOUNTER — Other Ambulatory Visit: Payer: Self-pay | Admitting: Student

## 2018-09-30 ENCOUNTER — Other Ambulatory Visit: Payer: Self-pay | Admitting: Radiology

## 2018-10-01 ENCOUNTER — Other Ambulatory Visit (HOSPITAL_COMMUNITY): Payer: Self-pay | Admitting: Interventional Radiology

## 2018-10-01 ENCOUNTER — Other Ambulatory Visit: Payer: Self-pay

## 2018-10-01 ENCOUNTER — Encounter (HOSPITAL_COMMUNITY): Payer: Self-pay | Admitting: Interventional Radiology

## 2018-10-01 ENCOUNTER — Ambulatory Visit (HOSPITAL_COMMUNITY)
Admission: RE | Admit: 2018-10-01 | Discharge: 2018-10-01 | Disposition: A | Discharge status: Home / Self Care | Payer: Medicare HMO | Source: Ambulatory Visit | Attending: Interventional Radiology | Admitting: Interventional Radiology

## 2018-10-01 DIAGNOSIS — S32020A Wedge compression fracture of second lumbar vertebra, initial encounter for closed fracture: Secondary | ICD-10-CM

## 2018-10-01 DIAGNOSIS — X58XXXA Exposure to other specified factors, initial encounter: Secondary | ICD-10-CM | POA: Diagnosis not present

## 2018-10-01 DIAGNOSIS — S32040A Wedge compression fracture of fourth lumbar vertebra, initial encounter for closed fracture: Secondary | ICD-10-CM | POA: Insufficient documentation

## 2018-10-01 DIAGNOSIS — S32050A Wedge compression fracture of fifth lumbar vertebra, initial encounter for closed fracture: Secondary | ICD-10-CM | POA: Insufficient documentation

## 2018-10-01 DIAGNOSIS — Z79899 Other long term (current) drug therapy: Secondary | ICD-10-CM | POA: Diagnosis not present

## 2018-10-01 DIAGNOSIS — I2510 Atherosclerotic heart disease of native coronary artery without angina pectoris: Secondary | ICD-10-CM | POA: Insufficient documentation

## 2018-10-01 HISTORY — PX: IR KYPHO LUMBAR INC FX REDUCE BONE BX UNI/BIL CANNULATION INC/IMAGING: IMG5519

## 2018-10-01 HISTORY — PX: IR KYPHO EA ADDL LEVEL THORACIC OR LUMBAR: IMG5520

## 2018-10-01 LAB — CBC
HCT: 40.9 % (ref 39.0–52.0)
Hemoglobin: 13.9 g/dL (ref 13.0–17.0)
MCH: 30.8 pg (ref 26.0–34.0)
MCHC: 34 g/dL (ref 30.0–36.0)
MCV: 90.7 fL (ref 80.0–100.0)
Platelets: 183 10*3/uL (ref 150–400)
RBC: 4.51 MIL/uL (ref 4.22–5.81)
RDW: 14.6 % (ref 11.5–15.5)
WBC: 4.8 10*3/uL (ref 4.0–10.5)
nRBC: 0 % (ref 0.0–0.2)

## 2018-10-01 LAB — PROTIME-INR
INR: 1.1 (ref 0.8–1.2)
Prothrombin Time: 14 seconds (ref 11.4–15.2)

## 2018-10-01 LAB — APTT: aPTT: 31 seconds (ref 24–36)

## 2018-10-01 MED ORDER — FENTANYL CITRATE (PF) 100 MCG/2ML IJ SOLN
INTRAMUSCULAR | Status: AC
  Filled 2018-10-01: qty 4

## 2018-10-01 MED ORDER — FENTANYL CITRATE (PF) 100 MCG/2ML IJ SOLN
INTRAMUSCULAR | Status: AC | PRN
  Administered 2018-10-01 (×3): 50 ug via INTRAVENOUS

## 2018-10-01 MED ORDER — LIDOCAINE HCL 1 % IJ SOLN
INTRAMUSCULAR | Status: AC | PRN
  Administered 2018-10-01: 30 mL

## 2018-10-01 MED ORDER — CEFAZOLIN SODIUM-DEXTROSE 2-4 GM/100ML-% IV SOLN
INTRAVENOUS | Status: AC
  Administered 2018-10-01: 2000 mg
  Filled 2018-10-01: qty 100

## 2018-10-01 MED ORDER — CEFAZOLIN SODIUM-DEXTROSE 2-4 GM/100ML-% IV SOLN: 2.0000 g | Freq: Once | INTRAVENOUS | Status: DC

## 2018-10-01 MED ORDER — MIDAZOLAM HCL 2 MG/2ML IJ SOLN
INTRAMUSCULAR | Status: AC | PRN
  Administered 2018-10-01 (×2): 1 mg via INTRAVENOUS
  Administered 2018-10-01: 0.5 mg via INTRAVENOUS

## 2018-10-01 MED ORDER — MIDAZOLAM HCL 2 MG/2ML IJ SOLN
INTRAMUSCULAR | Status: AC
  Filled 2018-10-01: qty 4

## 2018-10-01 MED ORDER — LIDOCAINE HCL (PF) 1 % IJ SOLN
INTRAMUSCULAR | Status: AC
  Filled 2018-10-01: qty 30

## 2018-10-01 MED ORDER — IOHEXOL 300 MG/ML  SOLN
50.0000 mL | Freq: Once | INTRAMUSCULAR | Status: AC | PRN
  Administered 2018-10-01: 10:00:00 30 mL

## 2018-10-01 MED ORDER — SODIUM CHLORIDE 0.9 % IV SOLN: INTRAVENOUS | Status: DC

## 2018-10-01 NOTE — H&P (Signed)
Chief Complaint: Patient was seen in consultation today for L2, L4, and L5 compression fractures/kyphoplasty/vertebroplasty.  Referring Physician(s): Princella Ion  Supervising Physician: Oley Balm  Patient Status: [facility] - Out-pt  History of Present Illness: Steven Doyle is a 85 y.o. male with a past medical history of CAD and diverticulitis. He is known to IR. He was first referred to our department at the request of Dr. Malvin Johns for management of T11, L1, and L3 compression fractures. He originally consulted with Dr. Deanne Coffer 07/31/2018 regarding management options. At that time, patient decided to purse kyphoplasty/vertebroplasty for management of his compression fractures. He underwent an image-guided T11, L1, and L3 kyphoplasty 08/06/2018 by Dr. Deanne Coffer. Patient did well post-procedure, but days following began experiencing the same pain but located lower in his spine. He re-consulted with Dr. Deanne Coffer 08/28/2018 to discuss new onset pain. MR lumbar spine was obtained which revealed L2, L4, and L5 compression fractures. At that time, patient decided to pursue kyphoplasty/vertebroplasty for management of his new compression fractures.  MR lumbar spine 09/03/2018: 1. New fractures of the L2 inferior endplate, L4 inferior endplate, and L5 superior endplate with minimal loss of vertebral body height and edema indicating recent injury. 2. Stable subacute T11, L1, and L3 compression deformities post augmentation with mild persistent edema. 3. Stable T12 chronic compression deformity. 4. Stable lumbar spondylosis with multifactorial moderate to severe left L4-5 and L5-S1 neural foraminal stenosis. Multilevel mild and moderate foraminal stenosis. Mild L3-4 spinal canal stenosis.  Patient presents today for possible image-guided L2, L4, and L5 kyphoplasty/vertebroplasty. Patient awake and alert laying in bed. Complains of low back pain, rated 6/10 at this time. Denies fever, chills, chest  pain, dyspnea, abdominal pain, bowel/bladder incontinence, or headache.  Patient is currently taking Xarelto 20 mg once daily- reports last dose 09/25/2018.   Past Medical History:  Diagnosis Date   Coronary artery disease    Diverticulitis     Past Surgical History:  Procedure Laterality Date   CORONARY ARTERY BYPASS GRAFT     HYDROCELE EXCISION / REPAIR     IR KYPHO EA ADDL LEVEL THORACIC OR LUMBAR  08/06/2018   IR KYPHO EA ADDL LEVEL THORACIC OR LUMBAR  08/06/2018   IR KYPHO LUMBAR INC FX REDUCE BONE BX UNI/BIL CANNULATION INC/IMAGING  08/06/2018   IR RADIOLOGIST EVAL & MGMT  07/31/2018   IR RADIOLOGIST EVAL & MGMT  08/28/2018   PROSTATE SURGERY     ROTATOR CUFF REPAIR      Allergies: Atorvastatin  Medications: Prior to Admission medications   Medication Sig Start Date End Date Taking? Authorizing Provider  acetaminophen (TYLENOL) 500 MG tablet Take 1,000 mg by mouth 3 (three) times daily as needed.   Yes [provider]  baclofen (LIORESAL) 10 MG tablet Take 10 mg by mouth 3 (three) times daily.   Yes [provider]  Calcium Carbonate-Vitamin D (CALCIUM-VITAMIN D) 600-125 MG-UNIT TABS Take 1 tablet by mouth 2 (two) times daily.   Yes [provider]  Cholecalciferol (VITAMIN D) 50 MCG (2000 UT) CAPS Take 2,000 Units by mouth daily.   Yes [provider]  diphenhydrAMINE (BENADRYL) 25 MG tablet Take 25 mg by mouth at bedtime.    Yes [provider]  fluticasone (FLONASE) 50 MCG/ACT nasal spray Place 2 sprays into both nostrils daily as needed for allergies.    Yes [provider]  magnesium hydroxide (MILK OF MAGNESIA) 400 MG/5ML suspension Take 30 mLs by mouth 2 (two) times daily as  needed for mild constipation or moderate constipation.   Yes [provider]  Multiple Vitamin (MULTIVITAMIN) tablet Take 1 tablet by mouth daily.    Yes [provider]  oxyCODONE-acetaminophen (PERCOCET/ROXICET) 5-325 MG  tablet Take 1 tablet by mouth every 4 (four) hours as needed for severe pain.  07/10/18  Yes [provider]  pantoprazole (PROTONIX) 40 MG tablet Take 40 mg by mouth daily.   Yes [provider]  Polyethylene Glycol 3350 (PEG 3350) POWD Take 34 g by mouth daily.    Yes [provider]  rosuvastatin (CRESTOR) 5 MG tablet Take 5 mg by mouth daily at 6 PM.    Yes [provider]  senna-docusate (SENOKOT-S) 8.6-50 MG tablet Take 2-3 tablets by mouth 2 (two) times daily as needed for moderate constipation.    Yes [provider]  traMADol (ULTRAM) 50 MG tablet Take 50 mg by mouth every 6 (six) hours as needed for moderate pain.  06/05/18  Yes [provider]  Omega-3 1000 MG CAPS Take 1,000 mg by mouth daily.     [provider]  rivaroxaban (XARELTO) 20 MG TABS tablet Take 20 mg by mouth daily with supper.  06/08/18 06/08/19  [provider]     No family history on file.  Social History   Socioeconomic History   Marital status: Married    Spouse name: Not on file   Number of children: Not on file   Years of education: Not on file   Highest education level: Not on file  Occupational History   Not on file  Social Needs   Financial resource strain: Not on file   Food insecurity:    Worry: Not on file    Inability: Not on file   Transportation needs:    Medical: Not on file    Non-medical: Not on file  Tobacco Use   Smoking status: Former Smoker   Smokeless tobacco: Never Used  Substance and Sexual Activity   Alcohol use: No   Drug use: Never   Sexual activity: Not on file  Lifestyle   Physical activity:    Days per week: Not on file    Minutes per session: Not on file   Stress: Not on file  Relationships   Social connections:    Talks on phone: Not on file    Gets together: Not on file    Attends religious service: Not on file    Active member of club or organization: Not on file     Attends meetings of clubs or organizations: Not on file    Relationship status: Not on file  Other Topics Concern   Not on file  Social History Narrative   Not on file     Review of Systems: A 12 point ROS discussed and pertinent positives are indicated in the HPI above.  All other systems are negative.  Review of Systems  Constitutional: Negative for chills and fever.  Respiratory: Negative for shortness of breath and wheezing.   Cardiovascular: Negative for chest pain and palpitations.  Gastrointestinal: Negative for abdominal pain.       Negative for bowel incontinence.  Genitourinary:       Negative for bladder incontinence.  Musculoskeletal: Positive for back pain.  Neurological: Negative for headaches.  Psychiatric/Behavioral: Negative for behavioral problems and confusion.    Vital Signs: BP (!) 166/85    Pulse 65    Temp 98.1 F (36.7 C) (Oral)    Resp  17    Ht 6\' 2"  (1.88 m)    Wt 160 lb (72.6 kg)    SpO2 97%    BMI 20.54 kg/m   Physical Exam Vitals signs and nursing note reviewed.  Constitutional:      General: He is not in acute distress.    Appearance: Normal appearance.  Cardiovascular:     Rate and Rhythm: Normal rate and regular rhythm.     Heart sounds: Normal heart sounds. No murmur.  Pulmonary:     Effort: Pulmonary effort is normal. No respiratory distress.     Breath sounds: Normal breath sounds. No wheezing.  Musculoskeletal:     Comments: Mild-moderate tenderness of midline spine in lumbar region.  Skin:    General: Skin is warm and dry.  Neurological:     Mental Status: He is alert and oriented to person, place, and time.  Psychiatric:        Mood and Affect: Mood normal.        Behavior: Behavior normal.        Thought Content: Thought content normal.        Judgment: Judgment normal.      MD Evaluation Airway: WNL Heart: WNL Abdomen: WNL Chest/ Lungs: WNL ASA  Classification: 3 Mallampati/Airway Score: One   Imaging: Mr  Lumbar Spine Wo Contrast  Result Date: 09/04/2018 CLINICAL DATA:  85 y/o M; post kyphoplasty 07/31/2018 for follow-up. EXAM: MRI LUMBAR SPINE WITHOUT CONTRAST TECHNIQUE: Multiplanar, multisequence MR imaging of the lumbar spine was performed. No intravenous contrast was administered. COMPARISON:  07/08/2018 lumbar spine MRI. FINDINGS: Segmentation:  Standard. Alignment:  Physiologic. Vertebrae: Compression deformities as follows: 1. Stable T11 moderate approximately 50% anterior compression fracture post augmentation and with persistent edema. 2. Stable chronic superior inferior T12 central endplate fractures. 3. Stable L1 superior endplate fracture with proximally 30% anterior loss of height post augmentation and with persistent edema. 4. New L2 inferior endplate fracture with minimal 10-20% loss of vertebral body height and edema indicating recent injury. 5. Stable L3 superior endplate fracture with 30-40% central loss of vertebral body height post augmentation with mild persistent edema. 6. New L4 inferior endplate fracture on the left with minimal 10% loss of vertebral body height and edema indicating recent injury. 7. New L5 superior endplate fracture with mild 20% loss of vertebral body height and edema indicating recent fracture. Conus medullaris and cauda equina: Conus extends to the L1 level. Conus and cauda equina appear normal. Paraspinal and other soft tissues: Multiple bilateral renal cysts measuring up to 4.7 cm arising upper pole of right kidney. Mild edema within the lower lumbar paraspinal muscles, likely muscle strain. Disc levels: T10-11: No significant disc displacement, foraminal stenosis, or canal stenosis. T11-12: No significant disc displacement, foraminal stenosis, or canal stenosis. T12-L1: No significant disc displacement, foraminal stenosis, or canal stenosis. L1-2: Mild disc bulge and facet hypertrophy. Small facet effusions. No significant foraminal or spinal canal stenosis. L2-3:  Mild disc bulge with facet and ligamentum flavum hypertrophy. Mild foraminal and lateral recess stenosis. No spinal canal stenosis. L3-4: Stable disc bulge with right subarticular and foraminal disc protrusion combined with bilateral facet and ligamentum flavum hypertrophy. Mild left and moderate right neural foraminal stenosis. Mild spinal canal stenosis. L4-5: Stable disc bulge, small left foraminal disc protrusion, facet hypertrophy, and ligamentum flavum hypertrophy. Moderate right and moderate to severe left neural foraminal stenosis. No spinal canal stenosis. L5-S1: Stable disc bulge, small left foraminal disc protrusion, and left-greater-than-right facet hypertrophy. Moderate  to severe left foraminal stenosis. No canal stenosis. IMPRESSION: 1. New fractures of the L2 inferior endplate, L4 inferior endplate, and L5 superior endplate with minimal loss of vertebral body height and edema indicating recent injury. 2. Stable subacute T11, L1, and L3 compression deformities post augmentation with mild persistent edema. 3. Stable T12 chronic compression deformity. 4. Stable lumbar spondylosis with multifactorial moderate to severe left L4-5 and L5-S1 neural foraminal stenosis. Multilevel mild and moderate foraminal stenosis. Mild L3-4 spinal canal stenosis. Electronically Signed   By: Mitzi Hansen M.D.   On: 09/04/2018 02:08   Mr Sacrum Si Joints Wo Contrast  Result Date: 09/04/2018 CLINICAL DATA:  Status post kyphoplasty with persistent back pain. Evaluate for possible sacral fracture. EXAM: MRI PELVIS/SACRUM WITHOUT CONTRAST TECHNIQUE: Multiplanar multisequence MR imaging of the pelvis was performed. No intravenous contrast was administered. COMPARISON:  MRI lumbar spine, same date. FINDINGS: Inferior endplate fracture of L4 is noted along with a superior endplate fracture of L5. No sacral fractures are identified. No sacral bone lesions. The SI joints are intact. Both hips are normally located. No  hip fracture or AVN. No significant intrapelvic abnormalities are identified. Mild median lobe hypertrophy of the prostate gland is noted impressing on the base of the bladder. IMPRESSION: Lower lumbar endplate fractures as demonstrated on the lumbar spine MRI examination. No sacral fractures. Electronically Signed   By: Rudie Meyer M.D.   On: 09/04/2018 09:55    Labs:  CBC: Recent Labs    02/04/18 0045 08/06/18 0703  WBC 5.2 4.2  HGB 13.5 13.3  HCT 39.3 40.9  PLT 179 211    COAGS: Recent Labs    08/06/18 0703  INR 1.04  APTT 32    BMP: Recent Labs    02/04/18 0045  NA 141  K 3.9  CL 106  CO2 28  GLUCOSE 121*  BUN 20  CALCIUM 9.1  CREATININE 1.05  GFRNONAA >60  GFRAA >60    LIVER FUNCTION TESTS: Recent Labs    02/04/18 0045  BILITOT 0.7  AST 28  ALT 16  ALKPHOS 55  PROT 7.1  ALBUMIN 4.1     Assessment and Plan:  L2, L4, and L5 compression fractures. Plan for image-guided L2, L4, and L5 kyphoplasty/vertebroplasty today with Dr. Deanne Coffer. Patient is NPO. Afebrile. Last dose Xarelto 09/25/2018- ok to proceed per IR protocol. INR pending.  Risks and benefits of L2, L4, and L5 kyphoplasty/vertebroplasty were discussed with the patient including, but not limited to education regarding the natural healing process of compression fractures without intervention, bleeding, infection, cement migration which may cause spinal cord damage, paralysis, pulmonary embolism or even death. This interventional procedure involves the use of X-rays and because of the nature of the planned procedure, it is possible that we will have prolonged use of X-ray fluoroscopy. Potential radiation risks to you include (but are not limited to) the following: - A slightly elevated risk for cancer  several years later in life. This risk is typically less than 0.5% percent. This risk is low in comparison to the normal incidence of human cancer, which is 33% for women and 50% for men  according to the [employer] Society. - Radiation induced injury can include skin redness, resembling a rash, tissue breakdown / ulcers and hair loss (which can be temporary or permanent).  The likelihood of either of these occurring depends on the difficulty of the procedure and whether you are sensitive to radiation due to previous procedures, disease, or genetic  conditions.  IF your procedure requires a prolonged use of radiation, you will be notified and given written instructions for further action.  It is your responsibility to monitor the irradiated area for the 2 weeks following the procedure and to notify your physician if you are concerned that you have suffered a radiation induced injury.   All of the patient's questions were answered, patient is agreeable to proceed. Consent signed and in chart.   Thank you for this interesting consult.  I greatly enjoyed meeting Steven Doyle and look forward to participating in their care.  A copy of this report was sent to the requesting provider on this date.  Electronically Signed: Elwin Mocha, PA-C 10/01/2018, 7:45 AM   I spent a total of 40 Minutes in face to face in clinical consultation, greater than 50% of which was counseling/coordinating care for L2, L4, and L5 compression fractures.

## 2018-10-01 NOTE — Procedures (Signed)
  Procedure: Kyphoplasty L2 L4 L5   EBL:   minimal Complications:  none immediate  See full dictation in [employer].  Thora Lance MD Main # [phone] Pager  [phone]

## 2018-10-01 NOTE — Progress Notes (Signed)
Discharge instructions given via telephone to wife. All questions answered and wife verbalized understanding of instructions

## 2018-10-01 NOTE — Discharge Instructions (Signed)
KYPHOPLASTY/VERTEBROPLASTY DISCHARGE INSTRUCTIONS ° °Medications: (check all that apply) ° °   Resume all home medications as before procedure. °   °Continue your pain medications as prescribed as needed.  Over the next 3-5 days, decrease your pain medication as tolerated.  Over the counter medications (i.e. Tylenol, ibuprofen, and aleve) may be substituted once severe/moderate pain symptoms have subsided. ° ° Wound Care: °- Bandages may be removed the day following your procedure.  You may get your incision wet once bandages are removed.  Bandaids may be used to cover the incisions until scab formation.  Topical ointments are optional. ° °- If you develop a fever greater than 101 degrees, have increased skin redness at the incision sites or pus-like oozing from incisions occurring within 1 week of the procedure, contact radiology at [phone] or [phone]. ° °- Ice pack to back for 15-20 minutes 2-3 time per day for first 2-3 days post procedure.  The ice will expedite muscle healing and help with the pain from the incisions. ° ° Activity: °- Bedrest today with limited activity for 24 hours post procedure. ° °- No driving for 48 hours. ° °- Increase your activity as tolerated after bedrest (with assistance if necessary). ° °- Refrain from any strenuous activity or heavy lifting (greater than 10 lbs.). ° ° Follow up: °- Contact radiology at [phone] or [phone] if any questions/concerns. ° °- A physician assistant from radiology will contact you in approximately 1 week. ° °- If a biopsy was performed at the time of your procedure, your referring physician should receive the results in usually 2-3 days. ° ° ° ° °Moderate Conscious Sedation, Adult, Care After °These instructions provide you with information about caring for yourself after your procedure. Your health care provider may also give you more specific instructions. Your treatment has been planned according to current medical practices, but problems sometimes  occur. Call your health care provider if you have any problems or questions after your procedure. °What can I expect after the procedure? °After your procedure, it is common: °· To feel sleepy for several hours. °· To feel clumsy and have poor balance for several hours. °· To have poor judgment for several hours. °· To vomit if you eat too soon. °Follow these instructions at home: °For at least 24 hours after the procedure: ° °· Do not: °? Participate in activities where you could fall or become injured. °? Drive. °? Use heavy machinery. °? Drink alcohol. °? Take sleeping pills or medicines that cause drowsiness. °? Make important decisions or sign legal documents. °? Take care of children on your own. °· Rest. °Eating and drinking °· Follow the diet recommended by your health care provider. °· If you vomit: °? Drink water, juice, or soup when you can drink without vomiting. °? Make sure you have little or no nausea before eating solid foods. °General instructions °· Have a responsible adult stay with you until you are awake and alert. °· Take over-the-counter and prescription medicines only as told by your health care provider. °· If you smoke, do not smoke without supervision. °· Keep all follow-up visits as told by your health care provider. This is important. °Contact a health care provider if: °· You keep feeling nauseous or you keep vomiting. °· You feel light-headed. °· You develop a rash. °· You have a fever. °Get help right away if: °· You have trouble breathing. °This information is not intended to replace advice given to you by your health care   provider. Make sure you discuss any questions you have with your health care provider. °Document Released: 04/02/2013 Document Revised: 11/15/2015 Document Reviewed: 10/02/2015 °Elsevier Interactive Patient Education © 2019 [employer] ° ° ° ° ° °

## 2018-10-01 NOTE — Sedation Documentation (Signed)
Pt transferred to stretcher, supine position.  Pt to remain flat at this time.  Transport at bedside, pt left with transport to go to short stay bed 13.

## 2018-10-01 NOTE — Sedation Documentation (Signed)
Dr Deanne Coffer aware of elevated BP.  Pt receiving sedation/pain meds per protocol

## 2018-10-01 NOTE — Sedation Documentation (Signed)
Pt in IR 2, positioned prone on IR table.  Bony prominences are protected with pads.  Pt placed on O2 2L via [place].  Continuous cardiac monitoring started

## 2018-10-22 ENCOUNTER — Other Ambulatory Visit: Payer: Self-pay | Admitting: Interventional Radiology

## 2018-10-22 DIAGNOSIS — G8918 Other acute postprocedural pain: Secondary | ICD-10-CM

## 2018-10-23 ENCOUNTER — Other Ambulatory Visit: Payer: Self-pay

## 2018-10-23 ENCOUNTER — Ambulatory Visit (HOSPITAL_COMMUNITY)
Admission: RE | Admit: 2018-10-23 | Discharge: 2018-10-23 | Disposition: A | Discharge status: Home / Self Care | Payer: Medicare HMO | Source: Ambulatory Visit | Attending: Interventional Radiology | Admitting: Interventional Radiology

## 2018-10-23 DIAGNOSIS — G8918 Other acute postprocedural pain: Secondary | ICD-10-CM | POA: Insufficient documentation

## 2018-10-27 DIAGNOSIS — R413 Other amnesia: Secondary | ICD-10-CM

## 2018-10-27 DIAGNOSIS — H25019 Cortical age-related cataract, unspecified eye: Secondary | ICD-10-CM

## 2018-10-27 HISTORY — DX: Cortical age-related cataract, unspecified eye: H25.019

## 2018-10-27 HISTORY — DX: Other amnesia: R41.3

## 2019-01-17 DIAGNOSIS — M810 Age-related osteoporosis without current pathological fracture: Secondary | ICD-10-CM | POA: Insufficient documentation

## 2019-01-17 DIAGNOSIS — M47816 Spondylosis without myelopathy or radiculopathy, lumbar region: Secondary | ICD-10-CM | POA: Insufficient documentation

## 2019-01-17 HISTORY — DX: Age-related osteoporosis without current pathological fracture: M81.0

## 2019-01-17 HISTORY — DX: Spondylosis without myelopathy or radiculopathy, lumbar region: M47.816

## 2019-08-18 IMAGING — MR MRI LUMBAR SPINE WITHOUT CONTRAST
4 of 5 series · 17 of 48 positions shown · non-contrast
Comparison: 07/08/2018 lumbar spine MRI.

CLINICAL DATA: 85 y/o M; post kyphoplasty 07/31/2018 for follow-up.

EXAM:
MRI LUMBAR SPINE WITHOUT CONTRAST
TECHNIQUE: Multiplanar, multisequence MR imaging of the lumbar spine was
performed. No intravenous contrast was administered.

[Series 4: T1 · sagittal · 4.0mm · 0.55mm/px · 3 of 16 slices shown (1 of 2)]
[im 4/16]
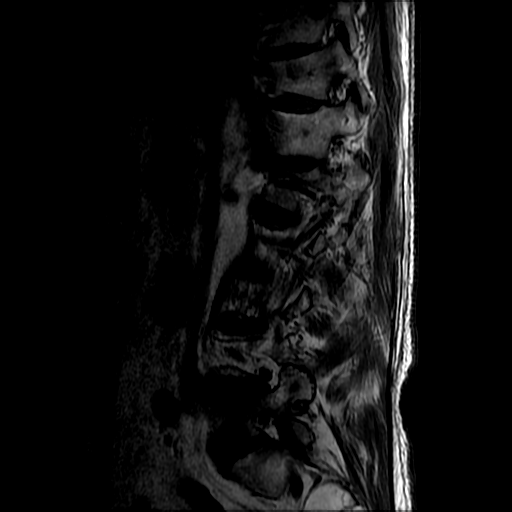
[im 10/16]
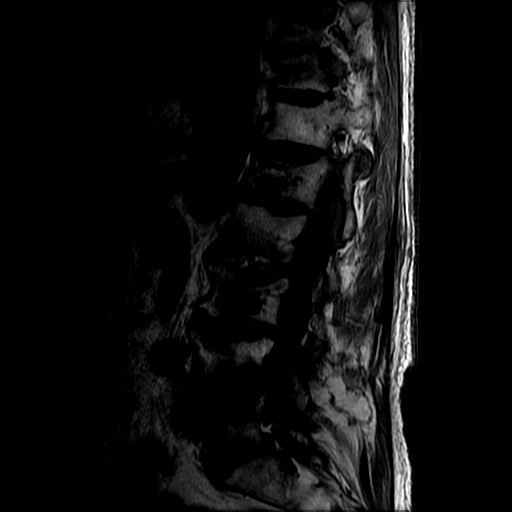
[im 16/16]
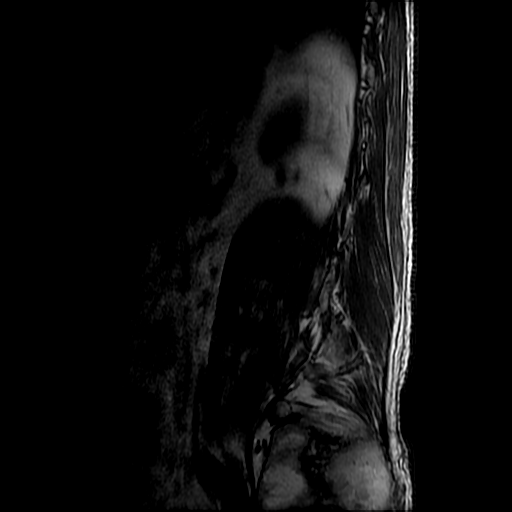

[Series 5: T2 · sagittal · 4.0mm · 0.55mm/px · 5 of 16 slices shown (1 of 2)]
[im 1/16]
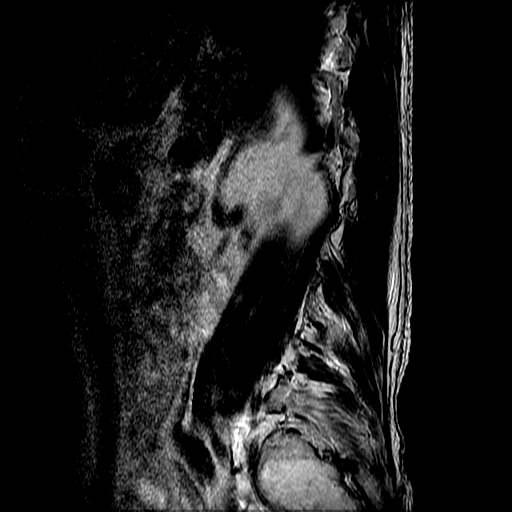
[im 4/16]
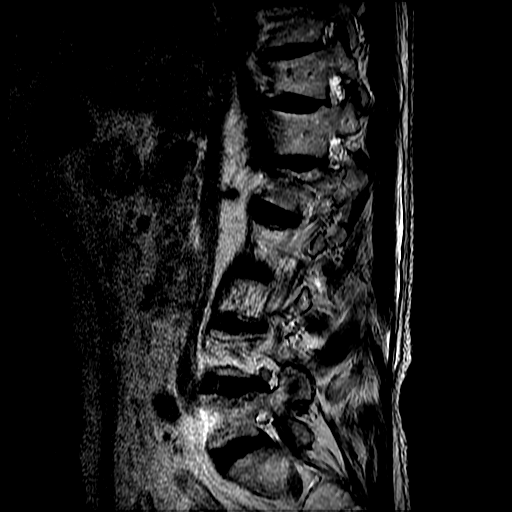
[im 8/16]
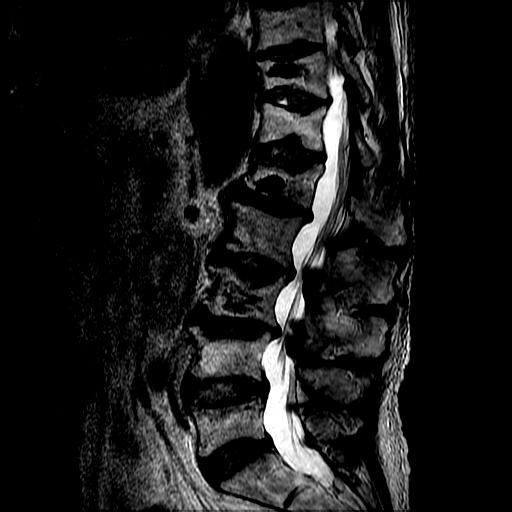
[im 12/16]
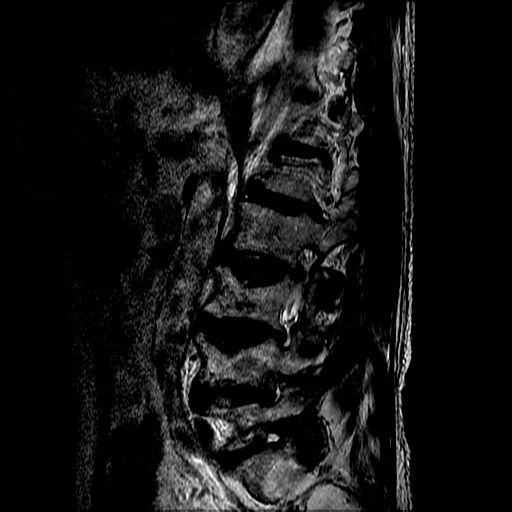
[im 16/16]
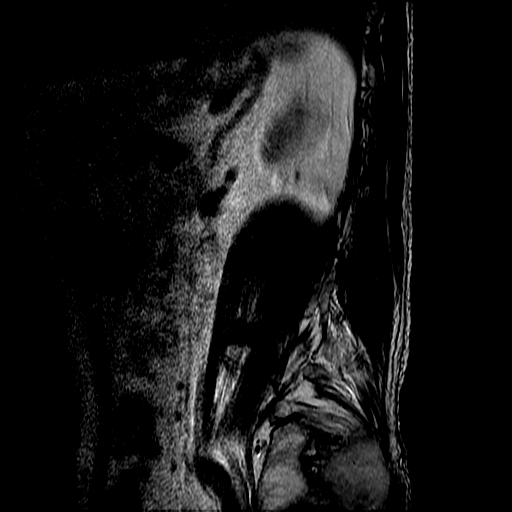

[Series 7: T2 · axial · 4.0mm · 0.39mm/px · z∈[-23,+189]mm · 6 of 52 slices shown (2 of 2)]
[im 4/52]
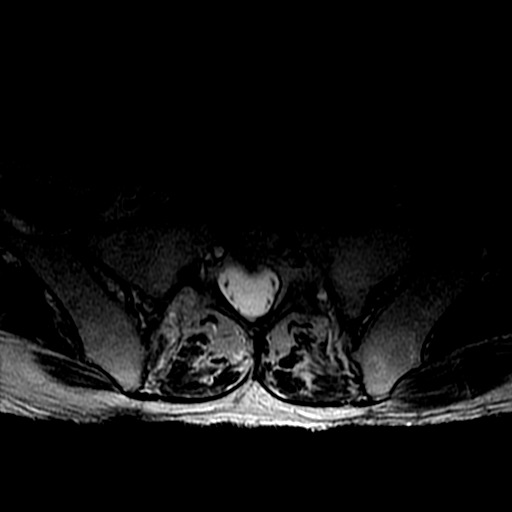
[im 7/52]
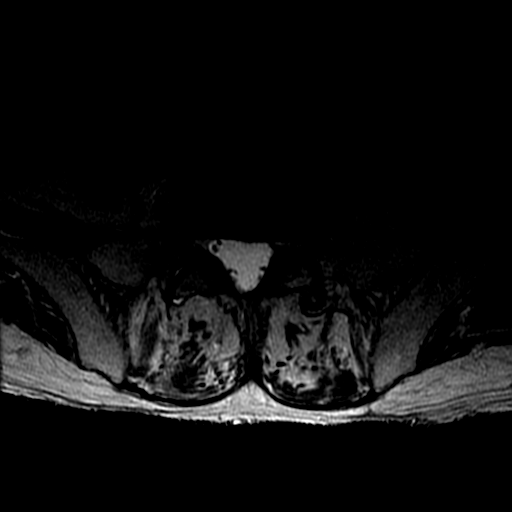
[im 11/52]
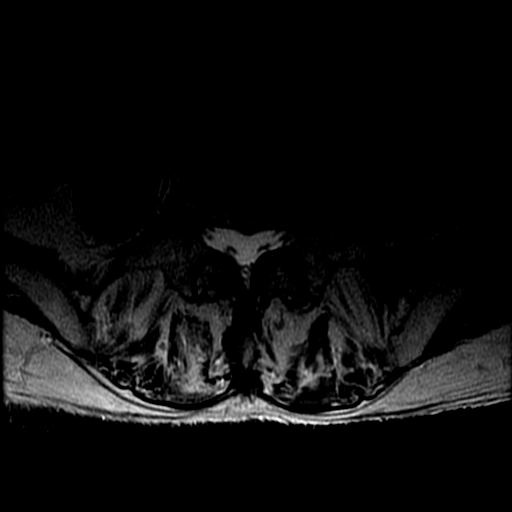
[im 18/52]
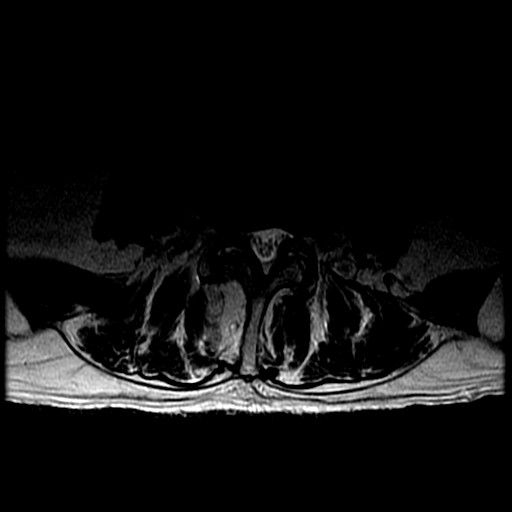
[im 28/52]
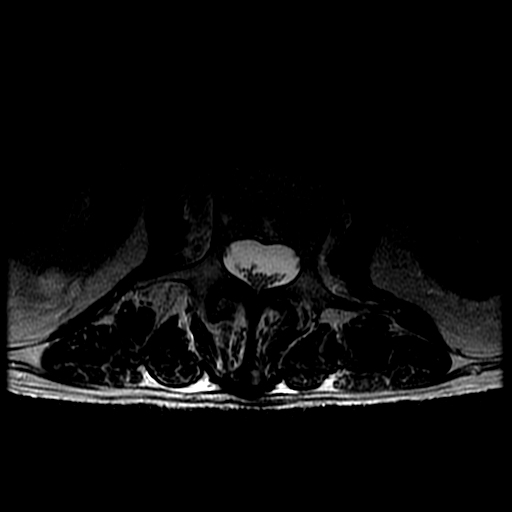
[im 45/52]
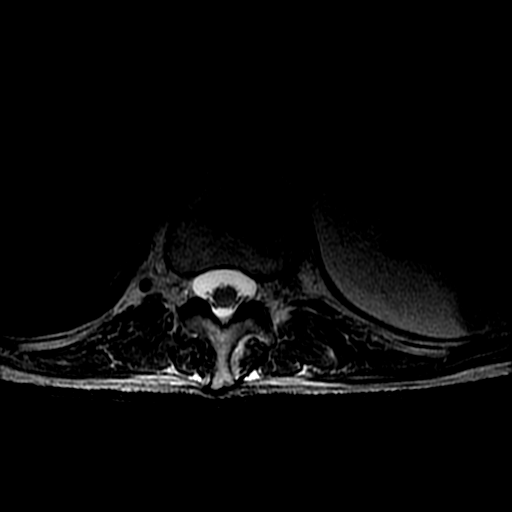

[Series 8: T1 · axial · 4.0mm · 0.39mm/px · z∈[-9,+189]mm · 3 of 52 slices shown (2 of 2)]
[im 7/52]
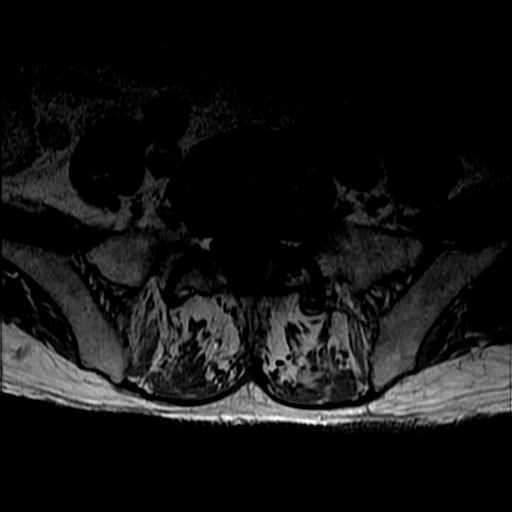
[im 28/52]
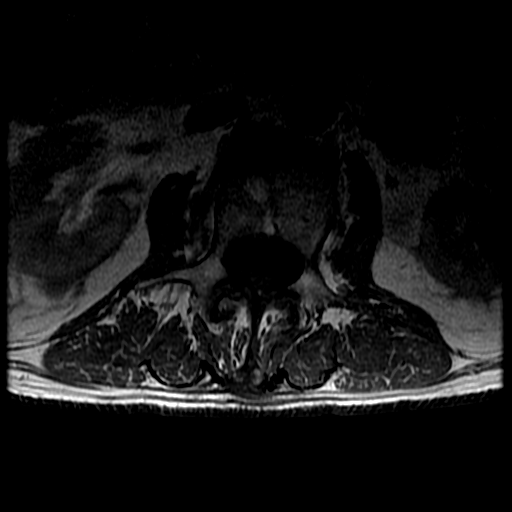
[im 45/52]
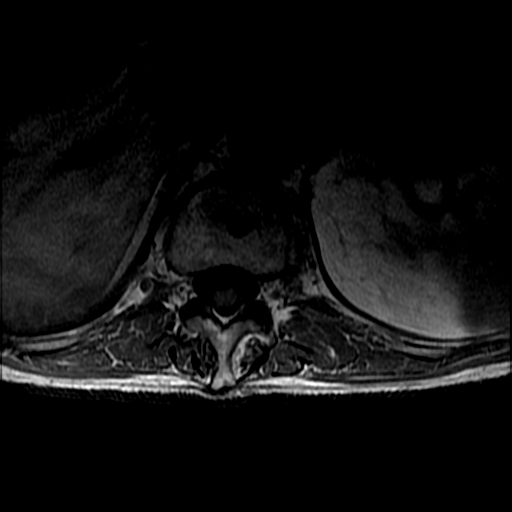

[17 of 48 positions shown; findings below may reference images not displayed]

FINDINGS: Segmentation:  Standard.

Alignment:  Physiologic.

Vertebrae: Compression deformities as follows:

1. Stable T11 moderate approximately 50% anterior compression
fracture post augmentation and with persistent edema.
2. Stable chronic superior inferior T12 central endplate fractures.
3. Stable L1 superior endplate fracture with proximally 30% anterior
loss of height post augmentation and with persistent edema.
4. New L2 inferior endplate fracture with minimal 10-20% loss of
vertebral body height and edema indicating recent injury.
5. Stable L3 superior endplate fracture with 30-40% central loss of
vertebral body height post augmentation with mild persistent edema.
6. New L4 inferior endplate fracture on the left with minimal 10%
loss of vertebral body height and edema indicating recent injury.
7. New L5 superior endplate fracture with mild 20% loss of vertebral
body height and edema indicating recent fracture.

Conus medullaris and cauda equina: Conus extends to the L1 level.
Conus and cauda equina appear normal.

Paraspinal and other soft tissues: Multiple bilateral renal cysts
measuring up to 4.7 cm arising upper pole of right kidney. Mild
edema within the lower lumbar paraspinal muscles, likely muscle
strain.

Disc levels:

T10-11: No significant disc displacement, foraminal stenosis, or
canal stenosis.

T11-12: No significant disc displacement, foraminal stenosis, or
canal stenosis.

T12-L1: No significant disc displacement, foraminal stenosis, or
canal stenosis.

L1-2: Mild disc bulge and facet hypertrophy. Small facet effusions.
No significant foraminal or spinal canal stenosis.

L2-3: Mild disc bulge with facet and ligamentum flavum hypertrophy.
Mild foraminal and lateral recess stenosis. No spinal canal
stenosis.

L3-4: Stable disc bulge with right subarticular and foraminal disc
protrusion combined with bilateral facet and ligamentum flavum
hypertrophy. Mild left and moderate right neural foraminal stenosis.
Mild spinal canal stenosis.

L4-5: Stable disc bulge, small left foraminal disc protrusion, facet
hypertrophy, and ligamentum flavum hypertrophy. Moderate right and
moderate to severe left neural foraminal stenosis. No spinal canal
stenosis.

L5-S1: Stable disc bulge, small left foraminal disc protrusion, and
left-greater-than-right facet hypertrophy. Moderate to severe left
foraminal stenosis. No canal stenosis.
IMPRESSION: 1. New fractures of the L2 inferior endplate, L4 inferior endplate,
and L5 superior endplate with minimal loss of vertebral body height
and edema indicating recent injury.
2. Stable subacute T11, L1, and L3 compression deformities post
augmentation with mild persistent edema.
3. Stable T12 chronic compression deformity.
4. Stable lumbar spondylosis with multifactorial moderate to severe
left L4-5 and L5-S1 neural foraminal stenosis. Multilevel mild and
moderate foraminal stenosis. Mild L3-4 spinal canal stenosis.

## 2020-11-01 ENCOUNTER — Emergency Department (HOSPITAL_BASED_OUTPATIENT_CLINIC_OR_DEPARTMENT_OTHER)
Admission: EM | Admit: 2020-11-01 | Discharge: 2020-11-01 | Disposition: A | Discharge status: Home / Self Care | Payer: Medicare HMO | Attending: Emergency Medicine | Admitting: Emergency Medicine

## 2020-11-01 ENCOUNTER — Encounter (HOSPITAL_BASED_OUTPATIENT_CLINIC_OR_DEPARTMENT_OTHER): Payer: Self-pay

## 2020-11-01 ENCOUNTER — Other Ambulatory Visit: Payer: Self-pay

## 2020-11-01 ENCOUNTER — Emergency Department (HOSPITAL_BASED_OUTPATIENT_CLINIC_OR_DEPARTMENT_OTHER): Payer: Medicare HMO

## 2020-11-01 DIAGNOSIS — K529 Noninfective gastroenteritis and colitis, unspecified: Secondary | ICD-10-CM | POA: Diagnosis not present

## 2020-11-01 DIAGNOSIS — I2510 Atherosclerotic heart disease of native coronary artery without angina pectoris: Secondary | ICD-10-CM | POA: Insufficient documentation

## 2020-11-01 DIAGNOSIS — M545 Low back pain, unspecified: Secondary | ICD-10-CM | POA: Diagnosis present

## 2020-11-01 DIAGNOSIS — Z20822 Contact with and (suspected) exposure to covid-19: Secondary | ICD-10-CM | POA: Insufficient documentation

## 2020-11-01 DIAGNOSIS — Z7901 Long term (current) use of anticoagulants: Secondary | ICD-10-CM | POA: Insufficient documentation

## 2020-11-01 DIAGNOSIS — Z87891 Personal history of nicotine dependence: Secondary | ICD-10-CM | POA: Insufficient documentation

## 2020-11-01 DIAGNOSIS — Z951 Presence of aortocoronary bypass graft: Secondary | ICD-10-CM | POA: Diagnosis not present

## 2020-11-01 DIAGNOSIS — K649 Unspecified hemorrhoids: Secondary | ICD-10-CM

## 2020-11-01 LAB — COMPREHENSIVE METABOLIC PANEL
ALT: 14 U/L (ref 0–44)
AST: 24 U/L (ref 15–41)
Albumin: 4.5 g/dL (ref 3.5–5.0)
Alkaline Phosphatase: 56 U/L (ref 38–126)
Anion gap: 11 (ref 5–15)
BUN: 42 mg/dL — ABNORMAL HIGH (ref 8–23)
CO2: 24 mmol/L (ref 22–32)
Calcium: 9.2 mg/dL (ref 8.9–10.3)
Chloride: 105 mmol/L (ref 98–111)
Creatinine, Ser: 1.21 mg/dL (ref 0.61–1.24)
GFR, Estimated: 59 mL/min — ABNORMAL LOW (ref >60)
Glucose, Bld: 90 mg/dL (ref 70–99)
Potassium: 3.9 mmol/L (ref 3.5–5.1)
Sodium: 140 mmol/L (ref 135–145)
Total Bilirubin: 1.4 mg/dL — ABNORMAL HIGH (ref 0.3–1.2)
Total Protein: 7.8 g/dL (ref 6.5–8.1)

## 2020-11-01 LAB — CBC WITH DIFFERENTIAL/PLATELET
Abs Immature Granulocytes: 0.02 10*3/uL (ref 0.00–0.07)
Basophils Absolute: 0.1 10*3/uL (ref 0.0–0.1)
Basophils Relative: 1 %
Eosinophils Absolute: 0.3 10*3/uL (ref 0.0–0.5)
Eosinophils Relative: 4 %
HCT: 50.6 % (ref 39.0–52.0)
Hemoglobin: 17.2 g/dL — ABNORMAL HIGH (ref 13.0–17.0)
Immature Granulocytes: 0 %
Lymphocytes Relative: 17 %
Lymphs Abs: 1.5 10*3/uL (ref 0.7–4.0)
MCH: 31.4 pg (ref 26.0–34.0)
MCHC: 34 g/dL (ref 30.0–36.0)
MCV: 92.5 fL (ref 80.0–100.0)
Monocytes Absolute: 0.4 10*3/uL (ref 0.1–1.0)
Monocytes Relative: 4 %
Neutro Abs: 6.3 10*3/uL (ref 1.7–7.7)
Neutrophils Relative %: 74 %
Platelets: 166 10*3/uL (ref 150–400)
RBC: 5.47 MIL/uL (ref 4.22–5.81)
RDW: 13.8 % (ref 11.5–15.5)
WBC: 8.5 10*3/uL (ref 4.0–10.5)
nRBC: 0 % (ref 0.0–0.2)

## 2020-11-01 LAB — URINALYSIS, MICROSCOPIC (REFLEX): WBC, UA: NONE SEEN WBC/hpf (ref 0–5)

## 2020-11-01 LAB — URINALYSIS, ROUTINE W REFLEX MICROSCOPIC
Bilirubin Urine: NEGATIVE
Glucose, UA: NEGATIVE mg/dL
Ketones, ur: 40 mg/dL — AB
Leukocytes,Ua: NEGATIVE
Nitrite: NEGATIVE
Protein, ur: NEGATIVE mg/dL
Specific Gravity, Urine: 1.015 (ref 1.005–1.030)
pH: 5 (ref 5.0–8.0)

## 2020-11-01 LAB — RESP PANEL BY RT-PCR (FLU A&B, COVID) ARPGX2
Influenza A by PCR: NEGATIVE
Influenza B by PCR: NEGATIVE
SARS Coronavirus 2 by RT PCR: NEGATIVE

## 2020-11-01 LAB — LIPASE, BLOOD: Lipase: 28 U/L (ref 11–51)

## 2020-11-01 MED ORDER — SODIUM CHLORIDE 0.9 % IV BOLUS
500.0000 mL | Freq: Once | INTRAVENOUS | Status: AC
  Administered 2020-11-01: 500 mL via INTRAVENOUS

## 2020-11-01 MED ORDER — ONDANSETRON 4 MG PO TBDP: 4.0000 mg | ORAL_TABLET | Freq: Three times a day (TID) | ORAL | 0 refills | Status: DC | PRN

## 2020-11-01 MED ORDER — IOHEXOL 300 MG/ML  SOLN
100.0000 mL | Freq: Once | INTRAMUSCULAR | Status: AC | PRN
  Administered 2020-11-01: 100 mL via INTRAVENOUS

## 2020-11-01 MED ORDER — DICYCLOMINE HCL 20 MG PO TABS: 20.0000 mg | ORAL_TABLET | Freq: Two times a day (BID) | ORAL | 0 refills | Status: DC

## 2020-11-01 MED ORDER — MORPHINE SULFATE (PF) 2 MG/ML IV SOLN
2.0000 mg | Freq: Once | INTRAVENOUS | Status: AC
  Administered 2020-11-01: 2 mg via INTRAVENOUS
  Filled 2020-11-01: qty 1

## 2020-11-01 MED ORDER — OXYCODONE-ACETAMINOPHEN 5-325 MG PO TABS: 1.0000 | ORAL_TABLET | Freq: Once | ORAL | Status: DC

## 2020-11-01 MED ORDER — MORPHINE SULFATE (PF) 2 MG/ML IV SOLN
2.0000 mg | Freq: Once | INTRAVENOUS | Status: AC
Start: 2020-11-01 — End: 2020-11-01
  Administered 2020-11-01: 2 mg via INTRAVENOUS
  Filled 2020-11-01: qty 1

## 2020-11-01 MED ORDER — SODIUM CHLORIDE 0.9 % IV BOLUS: 500.0000 mL | Freq: Once | INTRAVENOUS | Status: DC

## 2020-11-01 NOTE — ED Notes (Signed)
Pt transported to CT ?

## 2020-11-01 NOTE — ED Provider Notes (Signed)
[facility] EMERGENCY DEPARTMENT Provider Note   CSN: [id number] Arrival date & time: 11/01/20  1046     History No chief complaint on file.   Steven Doyle is a 87 y.o. male   HPI Patient is an 87 year old male with past medical history significant for CAD and diverticulitis  Patient is presented today with 24 hours of nausea vomiting and abdominal pain with diarrhea.  He also has some rectal pain  Per wife and patient, patient woke up yesterday morning felt somewhat tired and fatigued.  Had 1 episode of nonbloody nonbilious emesis and then developed some generalized achy abdominal pain.  He states that is been constant but mild since that time.  He also started having diarrhea later in the day.  He states he has had several episodes yesterday and 5 episodes so far today. He states no blood in his stool.  He states is very painful to wipe however.  He states it is not quite watery but quite loose. He states no further episodes of vomiting.  No fevers or chills.  No recent unusual food.  No recent known sick contacts.  He is vaccinated for COVID x2 and boosted.  He states he feels overall relatively well.  He also is complaining of some low back pain and states that he has a history of compression fractures and was curious if he has new compression fractures or whether this is exacerbation of his old fractures.  He denies any frequency urgency or dysuria or hematuria or any other urinary symptoms.  No chest pain shortness of breath cough lightheadedness or dizziness.  Denies any trauma. States that he has done some yard work recently which was somewhat effortful.     Past Medical History:  Diagnosis Date  . Coronary artery disease   . Diverticulitis     There are no problems to display for this patient.   Past Surgical History:  Procedure Laterality Date  . CORONARY ARTERY BYPASS GRAFT    . HYDROCELE EXCISION / REPAIR    . IR KYPHO EA ADDL LEVEL THORACIC OR LUMBAR   08/06/2018  . IR KYPHO EA ADDL LEVEL THORACIC OR LUMBAR  08/06/2018  . IR KYPHO EA ADDL LEVEL THORACIC OR LUMBAR  10/01/2018  . IR KYPHO EA ADDL LEVEL THORACIC OR LUMBAR  10/01/2018  . IR KYPHO LUMBAR INC FX REDUCE BONE BX UNI/BIL CANNULATION INC/IMAGING  08/06/2018  . IR KYPHO LUMBAR INC FX REDUCE BONE BX UNI/BIL CANNULATION INC/IMAGING  10/01/2018  . IR RADIOLOGIST EVAL & MGMT  07/31/2018  . IR RADIOLOGIST EVAL & MGMT  08/28/2018  . PROSTATE SURGERY    . ROTATOR CUFF REPAIR         No family history on file.  Social History   Tobacco Use  . Smoking status: Former Games developer  . Smokeless tobacco: Never Used  Substance Use Topics  . Alcohol use: No  . Drug use: Never    Home Medications Prior to Admission medications   Medication Sig Start Date End Date Taking? Authorizing Provider  dicyclomine (BENTYL) 20 MG tablet Take 1 tablet (20 mg total) by mouth 2 (two) times daily for 7 days. 11/01/20 11/08/20 Yes Trine Fread S, PA  ondansetron (ZOFRAN ODT) 4 MG disintegrating tablet Take 1 tablet (4 mg total) by mouth every 8 (eight) hours as needed for nausea or vomiting. 11/01/20  Yes Arilynn Blakeney, Stevphen Meuse S, PA  acetaminophen (TYLENOL) 500 MG tablet Take 1,000 mg by mouth 3 (three) times daily  as needed.    [provider]  baclofen (LIORESAL) 10 MG tablet Take 10 mg by mouth 3 (three) times daily.    [provider]  Calcium Carbonate-Vitamin D (CALCIUM-VITAMIN D) 600-125 MG-UNIT TABS Take 1 tablet by mouth 2 (two) times daily.    [provider]  Cholecalciferol (VITAMIN D) 50 MCG (2000 UT) CAPS Take 2,000 Units by mouth daily.    [provider]  diphenhydrAMINE (BENADRYL) 25 MG tablet Take 25 mg by mouth at bedtime.     [provider]  fluticasone (FLONASE) 50 MCG/ACT nasal spray Place 2 sprays into both nostrils daily as needed for allergies.     [provider]  magnesium hydroxide (MILK OF MAGNESIA) 400 MG/5ML suspension Take 30 mLs by mouth 2  (two) times daily as needed for mild constipation or moderate constipation.    [provider]  Multiple Vitamin (MULTIVITAMIN) tablet Take 1 tablet by mouth daily.     [provider]  Omega-3 1000 MG CAPS Take 1,000 mg by mouth daily.     [provider]  oxyCODONE-acetaminophen (PERCOCET/ROXICET) 5-325 MG tablet Take 1 tablet by mouth every 4 (four) hours as needed for severe pain.  07/10/18   [provider]  pantoprazole (PROTONIX) 40 MG tablet Take 40 mg by mouth daily.    [provider]  Polyethylene Glycol 3350 (PEG 3350) POWD Take 34 g by mouth daily.     [provider]  rivaroxaban (XARELTO) 20 MG TABS tablet Take 20 mg by mouth daily with supper.  06/08/18 06/08/19  [provider]  rosuvastatin (CRESTOR) 5 MG tablet Take 5 mg by mouth daily at 6 PM.     [provider]  senna-docusate (SENOKOT-S) 8.6-50 MG tablet Take 2-3 tablets by mouth 2 (two) times daily as needed for moderate constipation.     [provider]  traMADol (ULTRAM) 50 MG tablet Take 50 mg by mouth every 6 (six) hours as needed for moderate pain.  06/05/18   [provider]    Allergies    Atorvastatin  Review of Systems   Review of Systems  Constitutional: Positive for fatigue. Negative for chills and fever.  HENT: Negative for congestion.   Eyes: Negative for pain.  Respiratory: Negative for cough and shortness of breath.   Cardiovascular: Negative for chest pain and leg swelling.  Gastrointestinal: Positive for abdominal pain, diarrhea, nausea, rectal pain and vomiting.  Genitourinary: Negative for dysuria.  Musculoskeletal: Negative for myalgias.  Skin: Negative for rash.  Neurological: Negative for dizziness and headaches.    Physical Exam Updated Vital Signs BP (!) 162/90   Pulse (!) 56   Temp 97.8 F (36.6 C) (Oral)   Resp 18   Ht 6\' 2"  (1.88 m)   Wt 79.4 kg   SpO2 97%   BMI 22.47 kg/m   Physical  Exam Vitals and nursing note reviewed.  Constitutional:      General: He is not in acute distress. HENT:     Head: Normocephalic and atraumatic.     Nose: Nose normal.  Eyes:     General: No scleral icterus. Cardiovascular:     Rate and Rhythm: Normal rate and regular rhythm.     Pulses: Normal pulses.     Heart sounds: Normal heart sounds.  Pulmonary:     Effort: Pulmonary effort is normal. No respiratory distress.     Breath sounds: No wheezing.  Abdominal:     Palpations: Abdomen  is soft.     Tenderness: There is abdominal tenderness.     Comments: Mild tenderness to palpation of the abdomen with moderate focal tenderness in the left periumbilical region.  No guarding or rebound.    Genitourinary:    Comments: Sizable thrombosed hemorrhoid. Musculoskeletal:     Cervical back: Normal range of motion.     Right lower leg: No edema.     Left lower leg: No edema.     Comments: Some mild tenderness of the lumbar spine that is midline.  Skin:    General: Skin is warm and dry.     Capillary Refill: Capillary refill takes less than 2 seconds.  Neurological:     Mental Status: He is alert. Mental status is at baseline.  Psychiatric:        Mood and Affect: Mood normal.        Behavior: Behavior normal.     ED Results / Procedures / Treatments   Labs (all labs ordered are listed, but only abnormal results are displayed) Labs Reviewed  CBC WITH DIFFERENTIAL/PLATELET - Abnormal; Notable for the following components:      Result Value   Hemoglobin 17.2 (*)    All other components within normal limits  COMPREHENSIVE METABOLIC PANEL - Abnormal; Notable for the following components:   BUN 42 (*)    Total Bilirubin 1.4 (*)    GFR, Estimated 59 (*)    All other components within normal limits  URINALYSIS, ROUTINE W REFLEX MICROSCOPIC - Abnormal; Notable for the following components:   Hgb urine dipstick TRACE (*)    Ketones, ur 40 (*)    All other components within normal  limits  URINALYSIS, MICROSCOPIC (REFLEX) - Abnormal; Notable for the following components:   Bacteria, UA RARE (*)    All other components within normal limits  RESP PANEL BY RT-PCR (FLU A&B, COVID) ARPGX2  LIPASE, BLOOD    EKG None  Radiology CT ABDOMEN PELVIS W CONTRAST  Result Date: 11/01/2020 CLINICAL DATA:  Left flank pain. EXAM: CT ABDOMEN AND PELVIS WITH CONTRAST TECHNIQUE: Multidetector CT imaging of the abdomen and pelvis was performed using the standard protocol following bolus administration of intravenous contrast. CONTRAST:  100mL OMNIPAQUE IOHEXOL 300 MG/ML  SOLN COMPARISON:  April 28, 2018 FINDINGS: Lower chest: Multiple sternal wires are noted. Mild linear scarring is seen within the left lung base. Hepatobiliary: There is diffuse fatty infiltration of the liver parenchyma. No focal liver abnormality is seen. Status post cholecystectomy. No biliary dilatation. Pancreas: Unremarkable. No pancreatic ductal dilatation or surrounding inflammatory changes. Spleen: Normal in size without focal abnormality. Adrenals/Urinary Tract: Adrenal glands are unremarkable. Kidneys are normal in size, without renal calculi or hydronephrosis. A 5.3 cm x 4.0 cm simple cyst is seen within the posterolateral aspect of the mid to upper right kidney. Additional smaller cystic appearing areas are noted within both kidneys. Bladder is unremarkable. Stomach/Bowel: Stomach is within normal limits. Appendix appears normal. Mild to moderately thickened and inflamed loops of ileum are seen within the lower abdomen and pelvis. Multiple dilated small bowel loops are seen within the abdomen (maximum small bowel diameter of 4.0 cm) with a transition zone leading up to these loops of inflamed small bowel. Noninflamed diverticula are seen throughout the descending and sigmoid colon. Vascular/Lymphatic: Aortic atherosclerosis. No enlarged abdominal or pelvic lymph nodes. Reproductive: The prostate gland is moderately  enlarged. Other: A 4.6 cm x 3.1 cm fat containing left inguinal hernia is seen. A trace amount  of posterior pelvic free fluid is seen. Musculoskeletal: Prior vertebroplasty is seen at the levels of T11, L1, L2, L3, L4 and L5. IMPRESSION: 1. Mild to moderate severity enteritis involving multiple loops of ileum with subsequent partial small bowel obstruction. 2. Colonic diverticulosis. 3. Evidence of prior cholecystectomy. 4. Fat containing left inguinal hernia. 5. Enlarged prostate gland. Electronically Signed   By: Aram Candela M.D.   On: 11/01/2020 15:27   CT L-SPINE NO CHARGE  Result Date: 11/01/2020 CLINICAL DATA:  Left flank and rectal pain since yesterday. No known injury. EXAM: CT LUMBAR SPINE WITHOUT CONTRAST TECHNIQUE: Multidetector CT imaging of the lumbar spine was performed without intravenous contrast administration. Multiplanar CT image reconstructions were also generated. COMPARISON:  CT lumbar spine 10/23/2018. FINDINGS: Segmentation: Standard. Alignment: There is convex left scoliosis with the apex at L2-3. Vertebrae: No acute fracture. The patient is status post vertebral augmentation from L1-L5. Biconcave compression fracture of T12 is remote. Paraspinal and other soft tissues: See report of dedicated abdomen and pelvis CT today. Aortic atherosclerosis noted. Disc levels: Lumbar spondylosis appears worst at L3-4 and L4-5 where disc bulging and ligamentum flavum thickening cause moderate central canal stenosis. The neural foramina appear open at these levels. There is foraminal stenosis on the left at L5-S1 which is unchanged in appearance. IMPRESSION: No acute abnormality. Remote T12-L5 fractures. The patient is status post L1-L5 vertebral augmentation. Lumbar spondylosis which appears most notable at L3-4 and L4-5 where there is mild to moderate central canal stenosis. Left foraminal narrowing at L5-S1 also noted. Aortic Atherosclerosis (ICD10-I70.0). Electronically Signed   By: Drusilla Kanner M.D.   On: 11/01/2020 15:13    Procedures Procedures   Medications Ordered in ED Medications  morphine 2 MG/ML injection 2 mg (2 mg Intravenous Given 11/01/20 1400)  iohexol (OMNIPAQUE) 300 MG/ML solution 100 mL (100 mLs Intravenous Contrast Given 11/01/20 1429)  morphine 2 MG/ML injection 2 mg (2 mg Intravenous Given 11/01/20 1556)  sodium chloride 0.9 % bolus 500 mL (0 mLs Intravenous Stopped 11/01/20 1750)    ED Course  I have reviewed the triage vital signs and the nursing notes.  Pertinent labs & imaging results that were available during my care of the patient were reviewed by me and considered in my medical decision making (see chart for details).    Clinical Course as of 11/02/20 1154  Mon Nov 01, 2020  1530 Call received by radiology -- pt with low/medium grade SBO.  [WF]  1610 CT lumbar unremarkable  IMPRESSION: No acute abnormality.  Remote T12-L5 fractures. The patient is status post L1-L5 vertebral augmentation.  Lumbar spondylosis which appears most notable at L3-4 and L4-5 where there is mild to moderate central canal stenosis. Left foraminal narrowing at L5-S1 also noted.  Aortic Atherosclerosis (ICD10-I70.0). [WF]  1610 CT abd/pelvis IMPRESSION: 1. Mild to moderate severity enteritis involving multiple loops of ileum with subsequent partial small bowel obstruction. 2. Colonic diverticulosis. 3. Evidence of prior cholecystectomy. 4. Fat containing left inguinal hernia. 5. Enlarged prostate gland. [WF]  1611 Discussed with Dr. Donell Beers who reviewed CT scan and suspects gastroenteritis. Will treat conservatively.  [WF]    Clinical Course User Index [WF] Gailen Shelter, [place]   Patient is well-appearing and tolerating p.o.  I discussed the case with general surgery.  Patient offered admission but states that he would much prefer to be discharged home. I discussed this case with my attending physician who cosigned this note including patient's  presenting symptoms, physical exam,  and planned diagnostics and interventions. Attending physician stated agreement with plan or made changes to plan which were implemented.   I believe discharge home is reasonable given that this is the patient's preference, he has what appears to be gastroenteritis and general surgery believes CT results being over read.  He has symptoms consistent with gastroenteritis and not consistent with small bowel obstruction.  He is given strict return precautions and discharged with Bentyl and Zofran.  He was informed of the sedating side effects of Bentyl.  Will follow-up with PCP.  MDM Rules/Calculators/A&P                          All questions answered best my ability.  Patient tolerated p.o. and ambulatory.  Feels much improved.  Will discharge home at this time.  Given information on hemorrhoids will follow up with PCP about these.  Final Clinical Impression(s) / ED Diagnoses Final diagnoses:  Low back pain  Gastroenteritis    Rx / DC Orders ED Discharge Orders         Ordered    ondansetron (ZOFRAN ODT) 4 MG disintegrating tablet  Every 8 hours PRN        11/01/20 1807    dicyclomine (BENTYL) 20 MG tablet  2 times daily        11/01/20 1807           Gailen Shelter, [place] 11/02/20 1155    Melene Plan, DO 11/02/20 1444

## 2020-11-01 NOTE — Discharge Instructions (Signed)
Your symptoms are consistent with gastroenteritis.  The CT scan of your lumbar spine is without any new abnormalities.  It is without any significant changes from prior CT scans.  He may continue take Tylenol for this and other home medications as prescribed.  Drink plenty of water.  You may also supplement with Pedialyte.   Please use Bentyl as prescribed.  Use Zofran for nausea.  Please also with diarrhea.  Please follow-up with your primary care provider.  Return to the ER for any new or concerning symptoms.  Please read the attached information on hemorrhoids.  I have printed out a section of the information about the treatment.  Warm, sitz baths are the primary treatment.  Please use Preparation H wipes and cream as well.

## 2020-11-01 NOTE — ED Notes (Signed)
Pt advised that urine is needed 

## 2020-11-01 NOTE — ED Triage Notes (Signed)
Pt c/o left flank pain, n/v/d started yesterday-c/o hx of compression fx to back and also c/o rectal pain-NAD-steady gait

## 2020-11-02 ENCOUNTER — Other Ambulatory Visit (HOSPITAL_BASED_OUTPATIENT_CLINIC_OR_DEPARTMENT_OTHER): Payer: Self-pay

## 2021-07-04 DIAGNOSIS — Z86711 Personal history of pulmonary embolism: Secondary | ICD-10-CM

## 2021-07-04 HISTORY — DX: Personal history of pulmonary embolism: Z86.711

## 2022-01-25 DIAGNOSIS — M47816 Spondylosis without myelopathy or radiculopathy, lumbar region: Secondary | ICD-10-CM

## 2022-01-25 DIAGNOSIS — G8929 Other chronic pain: Secondary | ICD-10-CM

## 2022-01-25 DIAGNOSIS — M545 Low back pain, unspecified: Secondary | ICD-10-CM | POA: Insufficient documentation

## 2022-01-25 HISTORY — DX: Spondylosis without myelopathy or radiculopathy, lumbar region: M47.816

## 2022-01-25 HISTORY — DX: Other chronic pain: G89.29

## 2022-02-14 ENCOUNTER — Other Ambulatory Visit: Payer: Self-pay

## 2022-02-14 ENCOUNTER — Emergency Department (HOSPITAL_BASED_OUTPATIENT_CLINIC_OR_DEPARTMENT_OTHER)
Admission: EM | Admit: 2022-02-14 | Discharge: 2022-02-14 | Disposition: A | Discharge status: Home / Self Care | Payer: Medicare PPO | Attending: Emergency Medicine | Admitting: Emergency Medicine

## 2022-02-14 ENCOUNTER — Encounter (HOSPITAL_BASED_OUTPATIENT_CLINIC_OR_DEPARTMENT_OTHER): Payer: Self-pay

## 2022-02-14 ENCOUNTER — Emergency Department (HOSPITAL_BASED_OUTPATIENT_CLINIC_OR_DEPARTMENT_OTHER): Payer: Medicare PPO

## 2022-02-14 DIAGNOSIS — R1032 Left lower quadrant pain: Secondary | ICD-10-CM | POA: Diagnosis present

## 2022-02-14 DIAGNOSIS — Z951 Presence of aortocoronary bypass graft: Secondary | ICD-10-CM | POA: Diagnosis not present

## 2022-02-14 DIAGNOSIS — K458 Other specified abdominal hernia without obstruction or gangrene: Secondary | ICD-10-CM | POA: Insufficient documentation

## 2022-02-14 DIAGNOSIS — Z79899 Other long term (current) drug therapy: Secondary | ICD-10-CM | POA: Insufficient documentation

## 2022-02-14 DIAGNOSIS — I2510 Atherosclerotic heart disease of native coronary artery without angina pectoris: Secondary | ICD-10-CM | POA: Insufficient documentation

## 2022-02-14 DIAGNOSIS — K4090 Unilateral inguinal hernia, without obstruction or gangrene, not specified as recurrent: Secondary | ICD-10-CM

## 2022-02-14 LAB — COMPREHENSIVE METABOLIC PANEL
ALT: 17 U/L (ref 0–44)
AST: 21 U/L (ref 15–41)
Albumin: 4.1 g/dL (ref 3.5–5.0)
Alkaline Phosphatase: 38 U/L (ref 38–126)
Anion gap: 7 (ref 5–15)
BUN: 23 mg/dL (ref 8–23)
CO2: 28 mmol/L (ref 22–32)
Calcium: 8.6 mg/dL — ABNORMAL LOW (ref 8.9–10.3)
Chloride: 105 mmol/L (ref 98–111)
Creatinine, Ser: 1.15 mg/dL (ref 0.61–1.24)
GFR, Estimated: >60 mL/min (ref >60)
Glucose, Bld: 98 mg/dL (ref 70–99)
Potassium: 4.2 mmol/L (ref 3.5–5.1)
Sodium: 140 mmol/L (ref 135–145)
Total Bilirubin: 1.3 mg/dL — ABNORMAL HIGH (ref 0.3–1.2)
Total Protein: 7 g/dL (ref 6.5–8.1)

## 2022-02-14 LAB — CBC WITH DIFFERENTIAL/PLATELET
Abs Immature Granulocytes: 0.01 10*3/uL (ref 0.00–0.07)
Basophils Absolute: 0 10*3/uL (ref 0.0–0.1)
Basophils Relative: 1 %
Eosinophils Absolute: 0.1 10*3/uL (ref 0.0–0.5)
Eosinophils Relative: 2 %
HCT: 41.9 % (ref 39.0–52.0)
Hemoglobin: 14.5 g/dL (ref 13.0–17.0)
Immature Granulocytes: 0 %
Lymphocytes Relative: 16 %
Lymphs Abs: 1.1 10*3/uL (ref 0.7–4.0)
MCH: 31.5 pg (ref 26.0–34.0)
MCHC: 34.6 g/dL (ref 30.0–36.0)
MCV: 91.1 fL (ref 80.0–100.0)
Monocytes Absolute: 0.3 10*3/uL (ref 0.1–1.0)
Monocytes Relative: 5 %
Neutro Abs: 5 10*3/uL (ref 1.7–7.7)
Neutrophils Relative %: 76 %
Platelets: 165 10*3/uL (ref 150–400)
RBC: 4.6 MIL/uL (ref 4.22–5.81)
RDW: 13.8 % (ref 11.5–15.5)
WBC: 6.6 10*3/uL (ref 4.0–10.5)
nRBC: 0 % (ref 0.0–0.2)

## 2022-02-14 MED ORDER — IOHEXOL 300 MG/ML  SOLN
100.0000 mL | Freq: Once | INTRAMUSCULAR | Status: AC | PRN
  Administered 2022-02-14: 100 mL via INTRAVENOUS

## 2022-02-14 NOTE — ED Provider Notes (Addendum)
[facility] EMERGENCY DEPARTMENT Provider Note   CSN: [id number] Arrival date & time: 02/14/22  1218     History  Chief Complaint  Patient presents with   Pelvic Pain    Steven Doyle is a 88 y.o. male.  Patient with a complaint of discomfort left lower quadrant and there is a bulge in that area.  No bulge in the scrotal area or any pain radiating to the scrotal area.  No nausea or vomiting.  Has not noticed this there before.  Past medical history significant for diverticulitis and coronary artery disease.  Patient's had coronary artery bypass graft surgery.  Patient never smoked.  Patient denies any chest pain.       Home Medications Prior to Admission medications   Medication Sig Start Date End Date Taking? Authorizing Provider  acetaminophen (TYLENOL) 500 MG tablet Take 1,000 mg by mouth 3 (three) times daily as needed.    [provider]  baclofen (LIORESAL) 10 MG tablet Take 10 mg by mouth 3 (three) times daily.    [provider]  Calcium Carbonate-Vitamin D (CALCIUM-VITAMIN D) 600-125 MG-UNIT TABS Take 1 tablet by mouth 2 (two) times daily.    [provider]  Cholecalciferol (VITAMIN D) 50 MCG (2000 UT) CAPS Take 2,000 Units by mouth daily.    [provider]  dicyclomine (BENTYL) 20 MG tablet Take 1 tablet (20 mg total) by mouth 2 (two) times daily for 7 days. 11/01/20 11/08/20  Gailen Shelter, PA  diphenhydrAMINE (BENADRYL) 25 MG tablet Take 25 mg by mouth at bedtime.     [provider]  fluticasone (FLONASE) 50 MCG/ACT nasal spray Place 2 sprays into both nostrils daily as needed for allergies.     [provider]  magnesium hydroxide (MILK OF MAGNESIA) 400 MG/5ML suspension Take 30 mLs by mouth 2 (two) times daily as needed for mild constipation or moderate constipation.    [provider]  Multiple Vitamin (MULTIVITAMIN) tablet Take 1 tablet by mouth daily.     [provider]  Omega-3  1000 MG CAPS Take 1,000 mg by mouth daily.     [provider]  ondansetron (ZOFRAN ODT) 4 MG disintegrating tablet Take 1 tablet (4 mg total) by mouth every 8 (eight) hours as needed for nausea or vomiting. 11/01/20   Gailen Shelter, PA  oxyCODONE-acetaminophen (PERCOCET/ROXICET) 5-325 MG tablet Take 1 tablet by mouth every 4 (four) hours as needed for severe pain.  07/10/18   [provider]  pantoprazole (PROTONIX) 40 MG tablet Take 40 mg by mouth daily.    [provider]  Polyethylene Glycol 3350 (PEG 3350) POWD Take 34 g by mouth daily.     [provider]  rivaroxaban (XARELTO) 20 MG TABS tablet Take 20 mg by mouth daily with supper.  06/08/18 06/08/19  [provider]  rosuvastatin (CRESTOR) 5 MG tablet Take 5 mg by mouth daily at 6 PM.     [provider]  senna-docusate (SENOKOT-S) 8.6-50 MG tablet Take 2-3 tablets by mouth 2 (two) times daily as needed for moderate constipation.     [provider]  traMADol (ULTRAM) 50 MG tablet Take 50 mg by mouth every 6 (six) hours as needed for moderate pain.  06/05/18   [provider]      Allergies    Atorvastatin, Sulfa antibiotics, and Terazosin    Review of Systems   Review of Systems  Constitutional:  Negative for chills and  fever.  HENT:  Negative for ear pain and sore throat.   Eyes:  Negative for pain and visual disturbance.  Respiratory:  Negative for cough and shortness of breath.   Cardiovascular:  Negative for chest pain and palpitations.  Gastrointestinal:  Positive for abdominal pain. Negative for diarrhea, nausea and vomiting.  Genitourinary:  Negative for dysuria and hematuria.  Musculoskeletal:  Negative for arthralgias and back pain.  Skin:  Negative for color change and rash.  Neurological:  Negative for seizures and syncope.  All other systems reviewed and are negative.   Physical Exam Updated Vital Signs BP (!) 159/77 (BP Location: Left Arm)    Pulse (!) 55   Temp 97.9 F (36.6 C) (Oral)   Resp 18   Ht 1.88 m (6\' 2" )   Wt 83.9 kg   SpO2 96%   BMI 23.75 kg/m  Physical Exam Vitals and nursing note reviewed.  Constitutional:      General: He is not in acute distress.    Appearance: Normal appearance. He is well-developed.  HENT:     Head: Normocephalic and atraumatic.  Eyes:     Conjunctiva/sclera: Conjunctivae normal.  Cardiovascular:     Rate and Rhythm: Normal rate and regular rhythm.     Heart sounds: No murmur heard. Pulmonary:     Effort: Pulmonary effort is normal. No respiratory distress.     Breath sounds: Normal breath sounds.  Abdominal:     General: There is no distension.     Palpations: Abdomen is soft. There is mass.     Tenderness: There is abdominal tenderness. There is no guarding.     Hernia: A hernia is present.     Comments: Patient with about a 4 x 5 cm bulge left lower quadrant.  Not really generating into the inguinal area.  Some may be a Spegilian hernia.  No erythema not particularly tender.  Partially reducible.  Genitourinary:    Comments: Scrotum normal testes nontender. Musculoskeletal:        General: No swelling.     Cervical back: Neck supple.  Skin:    General: Skin is warm and dry.     Capillary Refill: Capillary refill takes less than 2 seconds.  Neurological:     Mental Status: He is alert.  Psychiatric:        Mood and Affect: Mood normal.     ED Results / Procedures / Treatments   Labs (all labs ordered are listed, but only abnormal results are displayed) Labs Reviewed  COMPREHENSIVE METABOLIC PANEL - Abnormal; Notable for the following components:      Result Value   Calcium 8.6 (*)    Total Bilirubin 1.3 (*)    All other components within normal limits  CBC WITH DIFFERENTIAL/PLATELET    EKG None  Radiology No results found.  Procedures Procedures    Medications Ordered in ED Medications  iohexol (OMNIPAQUE) 300 MG/ML solution 100 mL (100 mLs  Intravenous Contrast Given 02/14/22 1423)    ED Course/ Medical Decision Making/ A&P                           Medical Decision Making Amount and/or Complexity of Data Reviewed Labs: ordered. Radiology: ordered.  Risk Prescription drug management.   Clinically patient has a hernia of the left lower quadrant may be abdominal wall hernia.  May not be inguinal hernia.  We will get CT scan to further evaluate.  CBC  without any leukocytosis hemoglobin is normal.  Complete metabolic panel other than total bili being 1.3 no abnormalities renal functions normal.  Electrolytes normal.  Patient showing no signs of entrapment or strangulation.  Patient totally nontoxic.  Fairly large hernia suspect is been present for a while.  Partially reducible.  But may not be necessary to completely reduce it.  We will see what CT scan shows.  CT scan consistent with a left inguinal hernia just fat-containing.  No evidence of any bowel.  Based on this we will have patient just follow-up with [place] surgery for consideration for repair and various options at his age.  Patient nontoxic no acute distress.  Suspect he is got a little bit of mild discomfort there because the fat probably became inflamed.   Final Clinical Impression(s) / ED Diagnoses Final diagnoses:  Abdominal wall hernia    Rx / DC Orders ED Discharge Orders     None         Vanetta Mulders, MD 02/14/22 1453    Vanetta Mulders, MD 02/14/22 1517

## 2022-02-14 NOTE — ED Triage Notes (Signed)
C/o left pelvic pain since this morning. Denies any other symptoms. Denies testicular involvement or urinary symptoms.

## 2022-02-14 NOTE — Discharge Instructions (Signed)
CT is consistent with a left inguinal hernia does not contain any bowel which is reassuring.  Make an appointment to follow-up with general surgery for consideration for repair.  Also would follow-up with your primary care doctor.  Return for any fevers vomiting or worse pain.  Would recommend extra strength Tylenol as needed for any discomfort.

## 2022-02-14 NOTE — ED Notes (Signed)
Discharge instructions reviewed with patient. Patient verbalizes understanding, no further questions at this time. Medications and follow up information provided. No acute distress noted at time of departure.  

## 2022-08-28 ENCOUNTER — Ambulatory Visit: Payer: Medicare PPO | Admitting: Neurology

## 2022-09-04 ENCOUNTER — Ambulatory Visit: Payer: Medicare PPO | Admitting: Neurology

## 2022-09-04 ENCOUNTER — Encounter: Payer: Self-pay | Admitting: Neurology

## 2022-09-04 VITALS — BP 140/80

## 2022-09-04 DIAGNOSIS — I259 Chronic ischemic heart disease, unspecified: Secondary | ICD-10-CM | POA: Insufficient documentation

## 2022-09-04 DIAGNOSIS — G301 Alzheimer's disease with late onset: Secondary | ICD-10-CM

## 2022-09-04 DIAGNOSIS — B359 Dermatophytosis, unspecified: Secondary | ICD-10-CM | POA: Insufficient documentation

## 2022-09-04 DIAGNOSIS — B351 Tinea unguium: Secondary | ICD-10-CM | POA: Insufficient documentation

## 2022-09-04 DIAGNOSIS — F0280 Dementia in other diseases classified elsewhere without behavioral disturbance: Secondary | ICD-10-CM

## 2022-09-04 HISTORY — DX: Tinea unguium: B35.1

## 2022-09-04 HISTORY — DX: Chronic ischemic heart disease, unspecified: I25.9

## 2022-09-04 NOTE — Progress Notes (Signed)
Chief Complaint  Patient presents with   New Patient (Initial Visit)    Rm 14, with wife NP Paper referral for Memory changes,dementia [place] 15       ASSESSMENT AND PLAN  Steven Doyle is a 89 y.o. male   Dementia  MoCA examination 15/30  Complete evaluation with MRI of brain  Laboratory evaluation to rule out treatable etiology, B12, TSH   He already have significant memory loss, his partner [name] has significant visual difficulty, I do not think adding on more medication such as Aricept, Namenda would help the situation,  Discussed with patient, decided not to start any new medications, will let him know the result,  Only return To Clinic if there is any significant abnormality at the above testing, otherwise we will continue follow-up with his primary care physician, encouraging moderate exercise, social and physically active.   DIAGNOSTIC DATA (LABS, IMAGING, TESTING) - I reviewed patient records, labs, notes, testing and imaging myself where available.   MEDICAL HISTORY:  Steven Doyle is a 89 year old male, accompanied by his significant others of 40 years [name], seen in request by his primary care PA [place], Truddie Crumble, for evaluation of worsening memory loss, initial evaluation was on September 04, 2022  I reviewed and summarized the referring note.  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   Gen: NAD, conversant, well nourised, well groomed                     Cardiovascular: Regular rate rhythm, no peripheral edema, warm, nontender. Eyes: Conjunctivae clear without exudates or hemorrhage Neck: Supple, no carotid bruits. Pulmonary: Clear to auscultation bilaterally   NEUROLOGICAL EXAM:  MENTAL STATUS: Speech/cognition: Awake, alert, oriented to history taking and casual conversation    09/04/2022    3:52 PM  Montreal Cognitive Assessment   Visuospatial/ Executive (0/5) 3  Naming (0/3) 2  Attention: Read list of digits (0/2) 2  Attention: Read list of letters (0/1) 1  Attention: Serial 7 subtraction starting at 100 (0/3) 1  Language: Repeat phrase (0/2) 1  Language : Fluency (0/1) 1  Abstraction (0/2) 0  Delayed Recall (0/5) 0  Orientation (0/6) 4  Total 15    CRANIAL NERVES: CN II: Visual fields are full to confrontation. Pupils are round equal and briskly reactive to light. CN III, IV, VI: extraocular movement are normal. No ptosis. CN V: Facial sensation is intact to light touch CN VII: Face is symmetric  with normal eye closure  CN VIII: Hearing is normal to causal conversation. CN IX, X: Phonation is normal. CN XI: Head turning and shoulder shrug are intact  MOTOR: There is no pronator drift of out-stretched arms. Muscle bulk and tone are normal. Muscle strength is normal.  REFLEXES: Reflexes are 2+ and symmetric at the biceps, triceps, knees, and ankles. Plantar responses are flexor.  SENSORY: Intact to light touch, pinprick and vibratory sensation are intact in  fingers and toes.  COORDINATION: There is no trunk or limb dysmetria noted.  GAIT/STANCE: Posture is normal. Gait is steady    REVIEW OF SYSTEMS:  Full 14 system review of systems performed and notable only for as above All other review of systems were negative.   ALLERGIES: Allergies  Allergen Reactions   Atorvastatin Other (See Comments)    Muscle aches   Sulfa Antibiotics Swelling   Terazosin Swelling    HOME MEDICATIONS: Current Outpatient Medications  Medication Sig Dispense Refill   acetaminophen (TYLENOL) 500 MG tablet Take 1,000 mg by mouth 3 (three) times daily as needed.     baclofen (LIORESAL) 10 MG tablet Take 10 mg by mouth 3 (three) times daily.     Calcium Carbonate-Vitamin D (CALCIUM-VITAMIN D) 600-125 MG-UNIT TABS Take 1 tablet by mouth 2 (two) times daily.     Cholecalciferol (VITAMIN D) 50 MCG (2000 UT) CAPS Take 2,000 Units by mouth daily.     mirtazapine (REMERON) 15 MG tablet Take by mouth.     pantoprazole (PROTONIX) 40 MG tablet Take 40 mg by mouth daily.     Polyethylene Glycol 3350 (PEG 3350) POWD Take 34 g by mouth daily.      rosuvastatin (CRESTOR) 5 MG tablet Take 5 mg by mouth daily at 6 PM.      tiZANidine (ZANAFLEX) 4 MG tablet Take 4 mg by mouth every 6 (six) hours as needed for muscle spasms.     dicyclomine (BENTYL) 20 MG tablet Take 1 tablet (20 mg total) by mouth 2 (two) times daily for 7 days. 14 tablet 0   rivaroxaban (XARELTO) 20 MG TABS tablet Take 20 mg by mouth daily with supper.      No current facility-administered medications for this visit.    PAST MEDICAL HISTORY: Past Medical History:  Diagnosis Date   Coronary artery disease    Diverticulitis    Memory loss     PAST SURGICAL HISTORY: Past Surgical History:  Procedure Laterality Date   CORONARY ARTERY BYPASS GRAFT     HYDROCELE EXCISION / REPAIR     IR KYPHO EA ADDL LEVEL THORACIC OR LUMBAR  08/06/2018   IR KYPHO EA ADDL LEVEL THORACIC OR LUMBAR  08/06/2018   IR  KYPHO EA ADDL LEVEL THORACIC OR LUMBAR  10/01/2018   IR KYPHO EA ADDL LEVEL THORACIC OR LUMBAR  10/01/2018   IR KYPHO LUMBAR INC FX REDUCE BONE BX UNI/BIL CANNULATION INC/IMAGING  08/06/2018   IR KYPHO LUMBAR INC FX REDUCE BONE BX UNI/BIL CANNULATION INC/IMAGING  10/01/2018   IR RADIOLOGIST EVAL & MGMT  07/31/2018   IR RADIOLOGIST EVAL & MGMT  08/28/2018   PROSTATE SURGERY     ROTATOR CUFF REPAIR      FAMILY HISTORY: Family History  Problem Relation Age of Onset   Stomach cancer Other     SOCIAL HISTORY: Social History   Socioeconomic History   Marital status: Married    Spouse name: Not on file   Number of children: Not on  file   Years of education: Not on file   Highest education level: Not on file  Occupational History   Not on file  Tobacco Use   Smoking status: Former   Smokeless tobacco: Never  Substance and Sexual Activity   Alcohol use: No   Drug use: Never   Sexual activity: Not on file  Other Topics Concern   Not on file  Social History Narrative   Not on file   Social Determinants of Health   Financial Resource Strain: Not on file  Food Insecurity: Not on file  Transportation Needs: Not on file  Physical Activity: Not on file  Stress: Not on file  Social Connections: Not on file  Intimate Partner Violence: Not on file      Marcial Pacas, M.D. Ph.D.  [facility] [address] [place], [place] [address] Ph: [phone] Fax: [phone]  CC:  Evette Doffing [place], [place] [id number] [place],  [place] [id number]  Patient, No Pcp Per

## 2022-09-05 LAB — TSH: TSH: 1.71 u[IU]/mL (ref 0.450–4.500)

## 2022-09-05 LAB — VITAMIN B12: Vitamin B-12: 242 pg/mL (ref 232–1245)

## 2022-09-05 LAB — RPR: RPR Ser Ql: NONREACTIVE

## 2022-09-07 ENCOUNTER — Telehealth: Payer: Self-pay | Admitting: Neurology

## 2022-09-07 ENCOUNTER — Telehealth: Payer: Self-pay

## 2022-09-07 NOTE — Telephone Encounter (Signed)
Mcarthur Rossetti Josem Kaufmann: [id number] exp. 09/07/22-10/07/22 sent to GI [id number]  Patient said he did not want to go to [facility]. Preferred [facility], but we do not take medicare patients on the truck.

## 2022-09-07 NOTE — Telephone Encounter (Signed)
We can't have medicare patients on the MRI truck at [facility], I will send him to [facility].

## 2022-09-07 NOTE — Telephone Encounter (Signed)
I called patient.  I spoke with patient's wife, Shauna Hugh, per DPR.  I advised her of patient's lab results and recommendations.  They will add on a vitamin B12 1000 mcg supplement daily.  They wanted our MRI coordinator to know that they would prefer to have the MRI performed here at [facility].  They understand this is an insurance consideration.  They would not like it to be done at [facility] if possible.

## 2022-09-07 NOTE — Telephone Encounter (Signed)
-----   Message from Marcial Pacas, MD sent at 09/06/2022  2:58 PM EDT ----- Please call patient, laboratory evaluation showed  --- Low normal B12, 242, he would benefit over-the-counter B12 supplement, 1000 mcg daily.  --- Rest of the laboratory evaluation showed no significant abnormalities.

## 2022-09-29 ENCOUNTER — Other Ambulatory Visit: Payer: Medicare PPO

## 2022-10-04 ENCOUNTER — Emergency Department (HOSPITAL_BASED_OUTPATIENT_CLINIC_OR_DEPARTMENT_OTHER)
Admission: EM | Admit: 2022-10-04 | Discharge: 2022-10-04 | Disposition: A | Discharge status: Home / Self Care | Payer: Medicare PPO | Attending: Emergency Medicine | Admitting: Emergency Medicine

## 2022-10-04 ENCOUNTER — Other Ambulatory Visit: Payer: Self-pay

## 2022-10-04 DIAGNOSIS — Z7901 Long term (current) use of anticoagulants: Secondary | ICD-10-CM | POA: Insufficient documentation

## 2022-10-04 DIAGNOSIS — Z951 Presence of aortocoronary bypass graft: Secondary | ICD-10-CM | POA: Diagnosis not present

## 2022-10-04 DIAGNOSIS — I2510 Atherosclerotic heart disease of native coronary artery without angina pectoris: Secondary | ICD-10-CM | POA: Insufficient documentation

## 2022-10-04 DIAGNOSIS — K4090 Unilateral inguinal hernia, without obstruction or gangrene, not specified as recurrent: Secondary | ICD-10-CM | POA: Diagnosis not present

## 2022-10-04 DIAGNOSIS — F0390 Unspecified dementia without behavioral disturbance: Secondary | ICD-10-CM | POA: Diagnosis not present

## 2022-10-04 DIAGNOSIS — R109 Unspecified abdominal pain: Secondary | ICD-10-CM | POA: Diagnosis present

## 2022-10-04 LAB — CBC
HCT: 41.9 % (ref 39.0–52.0)
Hemoglobin: 14.3 g/dL (ref 13.0–17.0)
MCH: 31.1 pg (ref 26.0–34.0)
MCHC: 34.1 g/dL (ref 30.0–36.0)
MCV: 91.1 fL (ref 80.0–100.0)
Platelets: 182 10*3/uL (ref 150–400)
RBC: 4.6 MIL/uL (ref 4.22–5.81)
RDW: 13.9 % (ref 11.5–15.5)
WBC: 6.9 10*3/uL (ref 4.0–10.5)
nRBC: 0 % (ref 0.0–0.2)

## 2022-10-04 LAB — COMPREHENSIVE METABOLIC PANEL
ALT: 14 U/L (ref 0–44)
AST: 19 U/L (ref 15–41)
Albumin: 4.1 g/dL (ref 3.5–5.0)
Alkaline Phosphatase: 39 U/L (ref 38–126)
Anion gap: 6 (ref 5–15)
BUN: 28 mg/dL — ABNORMAL HIGH (ref 8–23)
CO2: 27 mmol/L (ref 22–32)
Calcium: 8.2 mg/dL — ABNORMAL LOW (ref 8.9–10.3)
Chloride: 104 mmol/L (ref 98–111)
Creatinine, Ser: 1.13 mg/dL (ref 0.61–1.24)
GFR, Estimated: >60 mL/min (ref >60)
Glucose, Bld: 98 mg/dL (ref 70–99)
Potassium: 3.8 mmol/L (ref 3.5–5.1)
Sodium: 137 mmol/L (ref 135–145)
Total Bilirubin: 1 mg/dL (ref 0.3–1.2)
Total Protein: 6.7 g/dL (ref 6.5–8.1)

## 2022-10-04 LAB — LIPASE, BLOOD: Lipase: 36 U/L (ref 11–51)

## 2022-10-04 NOTE — ED Provider Notes (Signed)
[facility] [place] Provider Note   CSN: [id number] Arrival date & time: 10/04/22  1229     History  Chief Complaint  Patient presents with   Abdominal Pain    Steven Doyle is a 89 y.o. male.  Patient sent in from [facility] urgent [facility] for left groin bulge.  And testicular swelling.  Patient without any nausea vomiting.  Patient without a fever.  Chart review shows that I saw the patient in August for the same thing.  Where he had a left groin hernia by CT that contained just fat.  Today it does appear to be a little bit bigger.  But it is reducible but will not stay reduced.  No concerns for incarceration at this time.  Patient has a history of dementia past medical history also significant for coronary artery disease diverticulitis past surgical history significant for coronary artery bypass past surgery patient is not a smoker.       Home Medications Prior to Admission medications   Medication Sig Start Date End Date Taking? Authorizing Provider  acetaminophen (TYLENOL) 500 MG tablet Take 1,000 mg by mouth 3 (three) times daily as needed.    [provider]  baclofen (LIORESAL) 10 MG tablet Take 10 mg by mouth 3 (three) times daily.    [provider]  Calcium Carbonate-Vitamin D (CALCIUM-VITAMIN D) 600-125 MG-UNIT TABS Take 1 tablet by mouth 2 (two) times daily.    [provider]  Cholecalciferol (VITAMIN D) 50 MCG (2000 UT) CAPS Take 2,000 Units by mouth daily.    [provider]  dicyclomine (BENTYL) 20 MG tablet Take 1 tablet (20 mg total) by mouth 2 (two) times daily for 7 days. 11/01/20 11/08/20  Gailen Shelter, PA  mirtazapine (REMERON) 15 MG tablet Take by mouth. 07/12/22   [provider]  pantoprazole (PROTONIX) 40 MG tablet Take 40 mg by mouth daily.    [provider]  Polyethylene Glycol 3350 (PEG 3350) POWD Take 34 g by mouth daily.     [provider]  rivaroxaban  (XARELTO) 20 MG TABS tablet Take 20 mg by mouth daily with supper.  06/08/18 06/08/19  [provider]  rosuvastatin (CRESTOR) 5 MG tablet Take 5 mg by mouth daily at 6 PM.     [provider]  tiZANidine (ZANAFLEX) 4 MG tablet Take 4 mg by mouth every 6 (six) hours as needed for muscle spasms.    [provider]      Allergies    Atorvastatin, Sulfa antibiotics, and Terazosin    Review of Systems   Review of Systems  Constitutional:  Negative for chills and fever.  HENT:  Negative for ear pain and sore throat.   Eyes:  Negative for pain and visual disturbance.  Respiratory:  Negative for cough and shortness of breath.   Cardiovascular:  Negative for chest pain and palpitations.  Gastrointestinal:  Negative for abdominal pain and vomiting.  Genitourinary:  Positive for scrotal swelling. Negative for dysuria and hematuria.  Musculoskeletal:  Negative for arthralgias and back pain.  Skin:  Negative for color change and rash.  Neurological:  Negative for seizures and syncope.  All other systems reviewed and are negative.   Physical Exam Updated Vital Signs BP (!) 170/84 (BP Location: Left Arm)   Pulse 60   Temp 98.2 F (36.8 C)   Resp 20   SpO2 100%  Physical Exam Vitals and nursing note reviewed.  Constitutional:  General: He is not in acute distress.    Appearance: Normal appearance. He is well-developed.  HENT:     Head: Normocephalic and atraumatic.  Eyes:     Conjunctiva/sclera: Conjunctivae normal.  Cardiovascular:     Rate and Rhythm: Normal rate and regular rhythm.     Heart sounds: No murmur heard. Pulmonary:     Effort: Pulmonary effort is normal. No respiratory distress.     Breath sounds: Normal breath sounds.  Abdominal:     Palpations: Abdomen is soft.     Tenderness: There is no abdominal tenderness.  Genitourinary:    Penis: Normal.      Comments: Left groin bulge is reducible.  With firm pressure.  Nontender.  Left  testicle more enlarged than the right but both testicles nontender no tenderness to the epididymis.  No significant scrotal swelling. Musculoskeletal:        General: No swelling.     Cervical back: Neck supple.  Skin:    General: Skin is warm and dry.     Capillary Refill: Capillary refill takes less than 2 seconds.  Neurological:     Mental Status: He is alert.  Psychiatric:        Mood and Affect: Mood normal.     ED Results / Procedures / Treatments   Labs (all labs ordered are listed, but only abnormal results are displayed) Labs Reviewed  COMPREHENSIVE METABOLIC PANEL - Abnormal; Notable for the following components:      Result Value   BUN 28 (*)    Calcium 8.2 (*)    All other components within normal limits  LIPASE, BLOOD  CBC    EKG None  Radiology No results found.  Procedures Procedures    Medications Ordered in ED Medications - No data to display  ED Course/ Medical Decision Making/ A&P                             Medical Decision Making  Patient with a left inguinal hernia that does appear to be larger than it was in August when I saw him.  CT scan at that time confirmed fat-containing left inguinal hernia.  They were referred to [facility] surgery but it appears they did not follow-up.  Patient definitely has dementia and some other health issues.  No signs of incarceration here today.  I can reduce the hernia.  But it does pop back out.  Also nontender.  No significant scrotal or testicular findings.  Lipase normal complete metabolic panel normal renal function normal.  CBC white count 6.9 hemoglobin 14.3 platelets 182.  The hernia does provide some discomfort but patient is certainly in no severe pain.  Based on his history of dementia and age we will just recommend treating with extra strength Tylenol and having them follow-[facility] with [facility].  Precautions provided to return for fevers persistent vomiting.   Final Clinical  Impression(s) / ED Diagnoses Final diagnoses:  Left inguinal hernia    Rx / DC Orders ED Discharge Orders     None         Vanetta Mulders, MD 10/04/22 1425

## 2022-10-04 NOTE — ED Notes (Signed)
IV Removed  

## 2022-10-04 NOTE — ED Triage Notes (Signed)
Patient presents to ED via POV from home. Here with left sided groin pain. Reports testicle swelling. Denies nausea or vomiting.

## 2022-10-04 NOTE — Discharge Instructions (Addendum)
I recommend extra strength Tylenol 2 tablets every 8 hours.  Follow-up for any fevers or persistent vomiting.  Make an appointment to follow-up with [facility].  Referral information provided above call and make an appointment.  Hernia today is still easily reducible.  But most likely will pop out frequently again.

## 2022-11-02 ENCOUNTER — Ambulatory Visit
Admission: RE | Admit: 2022-11-02 | Discharge: 2022-11-02 | Disposition: A | Discharge status: Home / Self Care | Payer: Medicare PPO | Source: Ambulatory Visit | Attending: Neurology | Admitting: Neurology

## 2022-11-02 DIAGNOSIS — G301 Alzheimer's disease with late onset: Secondary | ICD-10-CM

## 2022-11-07 ENCOUNTER — Telehealth: Payer: Self-pay

## 2022-11-07 NOTE — Telephone Encounter (Signed)
-----   Message from Levert Feinstein, MD sent at 11/07/2022  3:15 PM EDT ----- Please call patient, MRI of the brain showed no acute abnormality, mild age-related changes.

## 2022-11-07 NOTE — Telephone Encounter (Signed)
Call to wife to inform of MRI results. Wife appreciative.

## 2023-01-08 ENCOUNTER — Telehealth: Payer: Self-pay

## 2023-01-08 NOTE — Telephone Encounter (Signed)
PT wife called and stated that she wanted to know what stage of Alzheimers he is in and stated pt refuses to do things the correct way like Dr. Terrace Arabia wanted him to do. She says he won't exercise, eat right, and is defiant. She has no idea of what to do. She wanted to know if he needs blood work they want to know what stage of the disease that the pt is in and she stated that she is at her whitts end. I explained that this is normal with memory patients. But the she doesn't know what else she can do for him. Routing to Dr. Terrace Arabia.

## 2023-01-10 NOTE — Telephone Encounter (Signed)
Agree, patient was called impairment often have difficulty reasoning, if she insist on more advice, may offer follow-up appointment adductor nurse practitioner on my next available

## 2023-01-10 NOTE — Telephone Encounter (Signed)
Dr. Terrace Arabia says this requires an in office visit please offer appt. W/yan or np

## 2023-01-10 NOTE — Telephone Encounter (Signed)
Please call and offer appt with np or yan asap

## 2023-03-22 ENCOUNTER — Ambulatory Visit: Payer: Medicare PPO | Admitting: Adult Health

## 2023-03-28 ENCOUNTER — Ambulatory Visit: Payer: Medicare PPO | Admitting: Family

## 2023-03-28 ENCOUNTER — Encounter: Payer: Self-pay | Admitting: Family

## 2023-03-28 ENCOUNTER — Other Ambulatory Visit: Payer: Self-pay | Admitting: Family

## 2023-03-28 VITALS — BP 139/77 | HR 55 | Resp 16 | Ht 74.0 in | Wt 182.0 lb

## 2023-03-28 DIAGNOSIS — F411 Generalized anxiety disorder: Secondary | ICD-10-CM | POA: Diagnosis not present

## 2023-03-28 DIAGNOSIS — F0390 Unspecified dementia without behavioral disturbance: Secondary | ICD-10-CM

## 2023-03-28 DIAGNOSIS — G4700 Insomnia, unspecified: Secondary | ICD-10-CM | POA: Diagnosis not present

## 2023-03-28 DIAGNOSIS — Z86711 Personal history of pulmonary embolism: Secondary | ICD-10-CM | POA: Diagnosis not present

## 2023-03-28 DIAGNOSIS — M810 Age-related osteoporosis without current pathological fracture: Secondary | ICD-10-CM

## 2023-03-28 DIAGNOSIS — E538 Deficiency of other specified B group vitamins: Secondary | ICD-10-CM | POA: Diagnosis not present

## 2023-03-28 DIAGNOSIS — I10 Essential (primary) hypertension: Secondary | ICD-10-CM

## 2023-03-28 DIAGNOSIS — E785 Hyperlipidemia, unspecified: Secondary | ICD-10-CM

## 2023-03-28 DIAGNOSIS — R634 Abnormal weight loss: Secondary | ICD-10-CM

## 2023-03-28 DIAGNOSIS — K5909 Other constipation: Secondary | ICD-10-CM

## 2023-03-28 DIAGNOSIS — N401 Enlarged prostate with lower urinary tract symptoms: Secondary | ICD-10-CM

## 2023-03-28 DIAGNOSIS — N138 Other obstructive and reflux uropathy: Secondary | ICD-10-CM

## 2023-03-28 LAB — COMPREHENSIVE METABOLIC PANEL
ALT: 12 U/L (ref 0–53)
AST: 20 U/L (ref 0–37)
Albumin: 4.3 g/dL (ref 3.5–5.2)
Alkaline Phosphatase: 42 U/L (ref 39–117)
BUN: 21 mg/dL (ref 6–23)
CO2: 32 meq/L (ref 19–32)
Calcium: 9.5 mg/dL (ref 8.4–10.5)
Chloride: 105 meq/L (ref 96–112)
Creatinine, Ser: 1.22 mg/dL (ref 0.40–1.50)
GFR: 53.24 mL/min — ABNORMAL LOW (ref >60.00)
Glucose, Bld: 124 mg/dL — ABNORMAL HIGH (ref 70–99)
Potassium: 4.8 meq/L (ref 3.5–5.1)
Sodium: 142 meq/L (ref 135–145)
Total Bilirubin: 0.9 mg/dL (ref 0.2–1.2)
Total Protein: 6.6 g/dL (ref 6.0–8.3)

## 2023-03-28 LAB — LIPID PANEL
Cholesterol: 134 mg/dL (ref 0–200)
HDL: 44.9 mg/dL (ref >39.00)
LDL Cholesterol: 69 mg/dL (ref 0–99)
NonHDL: 89.4
Total CHOL/HDL Ratio: 3
Triglycerides: 101 mg/dL (ref 0.0–149.0)
VLDL: 20.2 mg/dL (ref 0.0–40.0)

## 2023-03-28 LAB — B12 AND FOLATE PANEL
Folate: 12.3 ng/mL (ref >5.9)
Vitamin B-12: 229 pg/mL (ref 211–911)

## 2023-03-28 NOTE — Assessment & Plan Note (Signed)
Wt Readings from Last 3 Encounters:  02/14/22 185 lb (83.9 kg)  11/01/20 175 lb (79.4 kg)  10/01/18 160 lb (72.6 kg)   Friend notes weight drop over last 10 years, but has been stable since 2022.

## 2023-03-28 NOTE — Assessment & Plan Note (Signed)
On Prolia, Last dose was 02/16/23.  Next one due 2/23

## 2023-03-28 NOTE — Assessment & Plan Note (Signed)
Wt Readings from Last 3 Encounters:  02/14/22 185 lb (83.9 kg)  11/01/20 175 lb (79.4 kg)  10/01/18 160 lb (72.6 kg)   Stable over the last year

## 2023-03-28 NOTE — Assessment & Plan Note (Signed)
No current voiding issues

## 2023-03-28 NOTE — Progress Notes (Unsigned)
Subjective:     Patient ID: Steven Doyle, male    DOB: [birth year], 89 y.o.   MRN: [id number]  No chief complaint on file.   HPI  Discussed the use of AI scribe software for clinical note transcription with the patient, who gave verbal consent to proceed.  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 He was most recently followed at [facility] but is seeking a new PCP closer to home. She assists with providing the patient's history due to his history of memory loss.    He has a history of heart disease, having undergone a seven-bypass heart surgery in 2008. He also has osteoporosis, for which he receives Prolia injections, and has a history of BPH.  He does not currently have any voiding issues. He has been experiencing memory issues and has been seen by Dr. Terrace Arabia for this [facility]).   He has been dealing with constipation, for which he takes Miralax daily. He has also been dealing with some emotional distress due to recent family losses and his inability to engage in activities he used to enjoy, such as mowing the grass and working with his tools.     Health Maintenance Due  Topic Date Due  . Zoster Vaccines- Shingrix (1 of 2) 07/24/1952  . INFLUENZA VACCINE  01/25/2023  . COVID-19 Vaccine (7 - 2023-24 season) 02/25/2023    Past Medical History:  Diagnosis Date  . Coronary artery disease   . History of diverticulitis   . Memory loss   . Pulmonary embolus [facility])     Past Surgical History:  Procedure Laterality Date  . CORONARY ARTERY BYPASS GRAFT  2008   x 7  . HYDROCELE EXCISION / REPAIR    . IR KYPHO EA ADDL LEVEL THORACIC OR LUMBAR  08/06/2018  . IR KYPHO EA ADDL LEVEL THORACIC OR LUMBAR  08/06/2018  . IR KYPHO EA ADDL LEVEL THORACIC OR LUMBAR  10/01/2018  . IR KYPHO EA ADDL LEVEL THORACIC OR LUMBAR  10/01/2018  . IR KYPHO LUMBAR INC FX REDUCE BONE BX  UNI/BIL CANNULATION INC/IMAGING  08/06/2018  . IR KYPHO LUMBAR INC FX REDUCE BONE BX UNI/BIL CANNULATION INC/IMAGING  10/01/2018  . IR RADIOLOGIST EVAL & MGMT  07/31/2018  . IR RADIOLOGIST EVAL & MGMT  08/28/2018  . PROSTATE SURGERY    . ROTATOR CUFF REPAIR      Family History  Problem Relation Age of Onset  . Arthritis/Rheumatoid Mother        died from study drug  . Breast cancer Sister 43  . Heart disease Brother   . Stomach cancer Other     Social History   Socioeconomic History  . Marital status: Married    Spouse name: Not on file  . Number of children: Not on file  . Years of education: Not on file  . Highest education level: Not on file  Occupational History  . Not on file  Tobacco Use  . Smoking status: Former  . Smokeless tobacco: Never  . Tobacco comments:    Smoked briefly while in the service for 3 years  Substance and Sexual Activity  . Alcohol use: No  . Drug use: Never  . Sexual activity: Not on file  Other Topics Concern  . Not on file  Social History Narrative   One daughter living estranged but lives  locally   Lives with male partner   Worked with [employer]   Grew up in [place]   Completed some high school   He served in the [employer]   Has 2 cats   Did not really know his father   Social Determinants of Corporate investment banker Strain: Not on file  Food Insecurity: Not on file  Transportation Needs: Not on file  Physical Activity: Not on file  Stress: Not on file  Social Connections: Unknown (11/10/2021)   Received from [employer]   Social Network   . Social Network: Not on file  Intimate Partner Violence: Unknown (11/10/2021)   Received from [facility]   HITS   . Physically Hurt: Not on file   . Insult or Talk Down To: Not on file   . Threaten Physical Harm: Not on file   . Scream or Curse: Not on file    Outpatient Medications Prior to Visit  Medication Sig Dispense Refill   . Calcium Carbonate-Vitamin D (CALCIUM-VITAMIN D) 600-125 MG-UNIT TABS Take 1 tablet by mouth 2 (two) times daily.    . Cholecalciferol (VITAMIN D) 50 MCG (2000 UT) CAPS Take 2,000 Units by mouth daily.    . mirtazapine (REMERON) 15 MG tablet Take by mouth.    . rosuvastatin (CRESTOR) 5 MG tablet Take 5 mg by mouth daily at 6 PM.     . tiZANidine (ZANAFLEX) 4 MG tablet Take 4 mg by mouth every 6 (six) hours as needed for muscle spasms.     No facility-administered medications prior to visit.    Allergies  Allergen Reactions  . Atorvastatin Other (See Comments)    Muscle aches  . Sulfa Antibiotics Swelling  . Terazosin Swelling    ROS See HPI    Objective:    Physical Exam Constitutional:      General: He is not in acute distress.    Appearance: He is well-developed.  HENT:     Head: Normocephalic and atraumatic.  Cardiovascular:     Rate and Rhythm: Normal rate and regular rhythm.     Heart sounds: Murmur heard.     Systolic murmur is present with a grade of 2/6.  Pulmonary:     Effort: Pulmonary effort is normal. No respiratory distress.     Breath sounds: Normal breath sounds. No wheezing or rales.  Skin:    General: Skin is warm and dry.  Neurological:     Mental Status: He is alert.     Comments: Alert, difficulty providing medical history  Psychiatric:        Behavior: Behavior normal.        Thought Content: Thought content normal.     BP 139/77   Pulse (!) 55   Resp 16   Ht 6\' 2"  (1.88 m)   Wt 182 lb (82.6 kg)   SpO2 98%   BMI 23.37 kg/m  Wt Readings from Last 3 Encounters:  03/28/23 182 lb (82.6 kg)  02/14/22 185 lb (83.9 kg)  11/01/20 175 lb (79.4 kg)       Assessment & Plan:   Problem List Items Addressed This Visit       Unprioritized   Dyslipidemia   Relevant Orders   Lipid panel   Comp Met (CMET)   Benign prostatic hyperplasia with urinary obstruction    No current voiding issues      Age-related osteoporosis without current  pathological fracture  On Prolia, Last dose was 02/16/23.  Next one due 2/23      Abnormal weight loss - Primary    Wt Readings from Last 3 Encounters:  02/14/22 185 lb (83.9 kg)  11/01/20 175 lb (79.4 kg)  10/01/18 160 lb (72.6 kg)   Stable over the last year      Other Visit Diagnoses     B12 deficiency       Relevant Orders   B12 and Folate Panel   Dementia without behavioral disturbance, psychotic disturbance, mood disturbance, or anxiety, unspecified dementia severity, unspecified dementia type (HCC)           I am having [name] maintain his rosuvastatin, Calcium-Vitamin D, Vitamin D, tiZANidine, and mirtazapine.  No orders of the defined types were placed in this encounter.

## 2023-03-28 NOTE — Progress Notes (Unsigned)
Subjective:     Patient ID: Steven Doyle, male    DOB: September 15, 1933, 89 y.o.   MRN: [id number]  Chief Complaint  Patient presents with   New Patient (Initial Visit)    Here to establish care    HPI  Discussed the use of AI scribe software for clinical note transcription with the patient, who gave verbal consent to proceed.  History of Present Illness               Health Maintenance Due  Topic Date Due   Medicare Annual Wellness (AWV)  Never done   DTaP/Tdap/Td (1 - Tdap) Never done   Zoster Vaccines- Shingrix (1 of 2) Never done   Pneumonia Vaccine 23+ Years old (1 of 1 - PCV) Never done   INFLUENZA VACCINE  Never done   COVID-19 Vaccine (1 - 2023-24 season) Never done    Past Medical History:  Diagnosis Date   Compression fracture of fifth lumbar vertebra (HCC)    Dementia (HCC)    Hypercholesterolemia     *** The histories are not reviewed yet. Please review them in the "History" navigator section and refresh this SmartLink.  History reviewed. No pertinent family history.  Social History   Socioeconomic History   Marital status: Single    Spouse name: Not on file   Number of children: Not on file   Years of education: Not on file   Highest education level: Not on file  Occupational History   Not on file  Tobacco Use   Smoking status: Former    Types: Cigarettes   Smokeless tobacco: Never  Substance and Sexual Activity   Alcohol use: Not Currently   Drug use: Not Currently   Sexual activity: Not Currently  Other Topics Concern   Not on file  Social History Narrative   Not on file   Social Determinants of Health   Financial Resource Strain: Not on file  Food Insecurity: Not on file  Transportation Needs: Not on file  Physical Activity: Not on file  Stress: Not on file  Social Connections: Not on file  Intimate Partner Violence: Not on file    Outpatient Medications Prior to Visit  Medication Sig Dispense Refill   Ca  Phosphate-Cholecalciferol (CALCIUM WITH D3) 250-12.5 MG-MCG CHEW Chew by mouth.     mirtazapine (REMERON) 15 MG tablet Take by mouth.     rosuvastatin (CRESTOR) 5 MG tablet Take by mouth.     tiZANidine (ZANAFLEX) 4 MG tablet Take by mouth.     No facility-administered medications prior to visit.    Not on File  ROS     Objective:    Physical Exam   There were no vitals taken for this visit. Wt Readings from Last 3 Encounters:  No data found for Wt       Assessment & Plan:   Problem List Items Addressed This Visit   None   I am having Steven Doyle maintain his mirtazapine, rosuvastatin, tiZANidine, and Calcium with D3.  No orders of the defined types were placed in this encounter.

## 2023-03-29 ENCOUNTER — Encounter: Payer: Self-pay | Admitting: Family

## 2023-03-29 ENCOUNTER — Telehealth: Payer: Self-pay | Admitting: Family

## 2023-03-29 DIAGNOSIS — K5909 Other constipation: Secondary | ICD-10-CM | POA: Insufficient documentation

## 2023-03-29 DIAGNOSIS — G4700 Insomnia, unspecified: Secondary | ICD-10-CM | POA: Insufficient documentation

## 2023-03-29 DIAGNOSIS — F0390 Unspecified dementia without behavioral disturbance: Secondary | ICD-10-CM | POA: Insufficient documentation

## 2023-03-29 DIAGNOSIS — E538 Deficiency of other specified B group vitamins: Secondary | ICD-10-CM

## 2023-03-29 HISTORY — DX: Other constipation: K59.09

## 2023-03-29 HISTORY — DX: Insomnia, unspecified: G47.00

## 2023-03-29 MED ORDER — VITAMIN B-12 1000 MCG PO TABS
1000.0000 ug | ORAL_TABLET | Freq: Every day | ORAL | Status: AC
Start: 2023-03-29 — End: ?

## 2023-03-29 NOTE — Assessment & Plan Note (Signed)
Managed with daily miralax.

## 2023-03-29 NOTE — Assessment & Plan Note (Signed)
Sees Neuro (Dr. Terrace Arabia) who did not think that additional medications would be helpful at this time.

## 2023-03-29 NOTE — Assessment & Plan Note (Signed)
He is maintained on mirtazapine.  Will need to discuss in further detail next visit and have him sign a controlled substance contract if we decide to continue.

## 2023-03-29 NOTE — Telephone Encounter (Signed)
Please contact pt's partner and let her know that his b12 is low.  Please begin b12 po once daily otc.    Cholesterol looks good.  Sugar is mildly elevated but this is not new for him.

## 2023-03-29 NOTE — Patient Instructions (Signed)
VISIT SUMMARY:  Dear Steven Doyle, thank you for coming in for your routine check-up. We discussed your ongoing health issues, including osteoporosis, memory loss, heart disease and chronic constipation. We also discussed your general health maintenance.  YOUR PLAN:  -OSTEOPOROSIS: You should continue your Prolia injections for osteoporosis. We will work on Clinical biochemist for you to receive these injections at our clinic after the new year. Osteoporosis is a condition that weakens bones, making them fragile and more likely to break.  -CONSTIPATION: You should continue taking Miralax as needed for your constipation. Constipation is a common condition that makes it difficult to have a bowel movement.  -MEMORY LOSS- you continue to follow with Dr. Terrace Arabia, neurology for your memory loss.  INSTRUCTIONS:  For your general health maintenance, you recently received your flu shot and COVID booster. You should plan to get the Shingrix vaccine at the pharmacy. We have scheduled a follow-up appointment for you in three months. We will also order labs for cholesterol, kidney function, liver function, glucose, and blood count.

## 2023-03-29 NOTE — Assessment & Plan Note (Signed)
Provoked, received 6 months of anticoagulation.

## 2023-03-29 NOTE — Assessment & Plan Note (Signed)
Maintained on crestor, update lipid panel.

## 2023-03-29 NOTE — Telephone Encounter (Signed)
Patient's partner was notified of result and provider's recommendations. She verbalized understanding

## 2023-03-29 NOTE — Assessment & Plan Note (Signed)
Currently stable.

## 2023-03-29 NOTE — Assessment & Plan Note (Signed)
BP Readings from Last 3 Encounters:  03/28/23 139/77  03/28/23 139/77  10/04/22 (!) 163/85   BP stable, not on antihypertensive- monitor.

## 2023-03-29 NOTE — Progress Notes (Signed)
Erroneous encounter

## 2023-04-17 ENCOUNTER — Encounter: Payer: Self-pay | Admitting: Neurology

## 2023-04-17 ENCOUNTER — Ambulatory Visit: Payer: Medicare PPO | Admitting: Neurology

## 2023-04-17 ENCOUNTER — Other Ambulatory Visit (HOSPITAL_BASED_OUTPATIENT_CLINIC_OR_DEPARTMENT_OTHER): Payer: Self-pay

## 2023-04-17 VITALS — BP 153/82 | HR 57 | Ht 74.0 in | Wt 182.0 lb

## 2023-04-17 DIAGNOSIS — G301 Alzheimer's disease with late onset: Secondary | ICD-10-CM

## 2023-04-17 DIAGNOSIS — F0280 Dementia in other diseases classified elsewhere without behavioral disturbance: Secondary | ICD-10-CM | POA: Diagnosis not present

## 2023-04-17 MED ORDER — NAMZARIC 28-10 MG PO CP24
1.0000 | ORAL_CAPSULE | Freq: Every day | ORAL | 11 refills | Status: DC
  Filled 2023-04-17: qty 30, 30d supply, fill #0

## 2023-04-17 NOTE — Progress Notes (Signed)
Chief Complaint  Patient presents with   Follow-up    Rm 15. Accompanied by friend. MOCA Alzheimer's disease.      ASSESSMENT AND PLAN  [name] is a 89 y.o. 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  Only return to clinic for new issues   DIAGNOSTIC DATA (LABS, IMAGING, TESTING) - I reviewed patient records, labs, notes, testing and imaging myself where available.   MEDICAL HISTORY:  Steven Doyle is a 89 year old male, accompanied by his significant others of 40 years Steven Doyle, seen in request by his primary care PA [place], Gaylord Shih, for evaluation of worsening memory loss, initial evaluation was on September 04, 2022  I reviewed and summarized the referring note.  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, rely on him to driving  Together they lost 2 children, only has 1 surviving daughter, they could no longer cooking, he drive out for a meal about once a day, walking okay but suffered significant low back pain, had multiple lumbar compression fracture in the  past, required kyphoplasty  Denies significant family history of memory loss  Laboratory evaluations in January 2024, for CBC CMP, vitamin D and lipid panel, but I do not have the report,  Reviewed MRI of the brain from May 2024, no acute abnormality mild atrophy mild to moderate small vessel disease  Laboratory showed normal B12 TSH negative RPR,  He become more sedentary, used to walk in his neighborhood, repeat MoCA 16/30 today  PHYSICAL EXAM:    PHYSICAL EXAMNIATION: Today's Vitals   04/17/23 1516  BP: (!) 153/82  Pulse: (!) 57  Weight: 182 lb (82.6 kg)  Height: 6\' 2"  (1.88 m)   Body mass index is 23.37 kg/m.   Gen: NAD, conversant, well nourised, well groomed                     Cardiovascular: Regular rate rhythm, no peripheral edema, warm, nontender. Eyes: Conjunctivae clear without exudates or hemorrhage Neck: Supple, no carotid bruits. Pulmonary: Clear to auscultation bilaterally   NEUROLOGICAL EXAM:  MENTAL STATUS: Speech/cognition: Awake, alert, oriented to history taking and casual conversation    04/17/2023    3:19 PM 09/04/2022    3:52 PM  Montreal Cognitive Assessment   Visuospatial/ Executive (0/5) 2 3  Naming (0/3) 2 2  Attention: Read list of digits (0/2) 2 2  Attention: Read list of letters (0/1) 1 1  Attention: Serial 7 subtraction starting at 100 (0/3) 3 1  Language: Repeat phrase (0/2) 0 1  Language : Fluency (0/1) 0 1  Abstraction (0/2) 0 0  Delayed Recall (0/5) 0 0  Orientation (0/6) 6 4  Total 16 15    CRANIAL NERVES: CN II: Visual fields are full to confrontation. Pupils are round equal and briskly reactive to light. CN III, IV, VI: extraocular movement are normal. No ptosis. CN V: Facial sensation is intact to light touch CN VII: Face is symmetric with normal eye closure  CN VIII: Hearing is normal to causal conversation. CN IX, X: Phonation is normal. CN XI: Head turning and shoulder shrug are intact  MOTOR: There is no  pronator drift of out-stretched arms. Muscle bulk and tone are normal. Muscle strength is normal.  REFLEXES: Reflexes are 1 and symmetric   SENSORY: Intact to light touch, pinprick and vibratory sensation are intact in fingers and toes.  COORDINATION: There is no trunk or limb dysmetria noted.  GAIT/STANCE: Push-up to get up from seated position, gait is steady    REVIEW OF SYSTEMS:  Full 14 system review of systems performed and notable only for as above All other review of systems were negative.   ALLERGIES: Allergies  Allergen Reactions   Atorvastatin Other (See Comments)    Muscle aches   Sulfa Antibiotics Swelling   Terazosin Swelling    HOME MEDICATIONS: Current Outpatient Medications  Medication Sig Dispense Refill   Ca Phosphate-Cholecalciferol (CALCIUM WITH D3) 250-12.5 MG-MCG CHEW Chew by mouth.     cyanocobalamin (VITAMIN B12) 1000 MCG tablet Take 1 tablet (1,000 mcg total) by mouth daily.     mirtazapine (REMERON) 15 MG tablet Take by mouth.     rosuvastatin (CRESTOR) 5 MG tablet Take 5 mg by mouth daily at 6 PM.      tiZANidine (ZANAFLEX) 4 MG tablet Take by mouth.     No current facility-administered medications for this visit.    PAST MEDICAL HISTORY: Past Medical History:  Diagnosis Date   Compression fracture of fifth lumbar vertebra (HCC)    Coronary artery disease    Dementia (HCC)    Derangement of posterior horn of medial meniscus 12/03/2013   History of diverticulitis    History of herpes zoster 02/16/2014   Hypercholesterolemia    Memory loss    Pulmonary embolus (HCC)     PAST SURGICAL HISTORY: Past Surgical History:  Procedure Laterality Date   CARDIAC SURGERY     2008 bypass   CORONARY ARTERY BYPASS GRAFT  2008   x 7   HYDROCELE EXCISION / REPAIR     IR KYPHO EA ADDL LEVEL THORACIC OR LUMBAR  08/06/2018   IR KYPHO EA ADDL LEVEL THORACIC OR LUMBAR  08/06/2018   IR KYPHO EA ADDL LEVEL THORACIC OR LUMBAR  10/01/2018   IR KYPHO EA  ADDL LEVEL THORACIC OR LUMBAR  10/01/2018   IR KYPHO LUMBAR INC FX REDUCE BONE BX UNI/BIL CANNULATION INC/IMAGING  08/06/2018   IR KYPHO LUMBAR INC FX REDUCE BONE BX UNI/BIL CANNULATION INC/IMAGING  10/01/2018   IR RADIOLOGIST EVAL & MGMT  07/31/2018   IR RADIOLOGIST EVAL & MGMT  08/28/2018   PROSTATE SURGERY     ROTATOR CUFF REPAIR      FAMILY HISTORY: Family History  Problem Relation Age of Onset   Arthritis/Rheumatoid Mother        died from study drug   Breast cancer Sister 81   Heart disease Brother    Stomach cancer Other     SOCIAL HISTORY: Social History   Socioeconomic History   Marital status: Married    Spouse name: Not on file   Number of  children: Not on file   Years of education: Not on file   Highest education level: Not on file  Occupational History   Not on file  Tobacco Use   Smoking status: Former    Types: Cigarettes   Smokeless tobacco: Never   Tobacco comments:    Smoked briefly while in the service for 3 years  Substance and Sexual Activity   Alcohol use: No   Drug use: Never   Sexual activity: Not Currently  Other Topics Concern   Not on file  Social History Narrative   ** Merged History Encounter **       One daughter living estranged but lives locally Lives with male partner Worked with Software engineer Grew up in [place] Completed some high school He served in the [employer] Has 2 cats Did not really know his father    Social Determinants of Corporate investment banker Strain: Not on file  Food Insecurity: Not on file  Transportation Needs: Not on file  Physical Activity: Not on file  Stress: Not on file  Social Connections: Unknown (11/10/2021)   Received from [facility]   Social Network    Social Network: Not on file  Intimate Partner Violence: Unknown (11/10/2021)   Received from [facility], [facility]   HITS    Physically Hurt: Not on file    Insult or Talk Down To: Not on file     Threaten Physical Harm: Not on file    Scream or Curse: Not on file      Levert Feinstein, M.D. Ph.D.  [facility] [address], [address] [place], [place] [address] Ph: [phone] Fax: [phone]  CC:  Clemencia Course, [place] [address] [place],  [place] [address]  Sandford Craze, NP

## 2023-04-19 ENCOUNTER — Other Ambulatory Visit (HOSPITAL_BASED_OUTPATIENT_CLINIC_OR_DEPARTMENT_OTHER): Payer: Self-pay

## 2023-04-20 ENCOUNTER — Other Ambulatory Visit (HOSPITAL_BASED_OUTPATIENT_CLINIC_OR_DEPARTMENT_OTHER): Payer: Self-pay

## 2023-04-24 ENCOUNTER — Other Ambulatory Visit (HOSPITAL_BASED_OUTPATIENT_CLINIC_OR_DEPARTMENT_OTHER): Payer: Self-pay

## 2023-04-24 ENCOUNTER — Other Ambulatory Visit: Payer: Self-pay | Admitting: Neurology

## 2023-04-24 MED ORDER — NAMZARIC 28-10 MG PO CP24
1.0000 | ORAL_CAPSULE | Freq: Every day | ORAL | 11 refills | Status: DC
  Filled 2023-04-24: qty 30, fill #0

## 2023-05-22 ENCOUNTER — Other Ambulatory Visit (HOSPITAL_BASED_OUTPATIENT_CLINIC_OR_DEPARTMENT_OTHER): Payer: Self-pay

## 2023-05-22 ENCOUNTER — Telehealth: Payer: Self-pay | Admitting: Family

## 2023-05-23 ENCOUNTER — Other Ambulatory Visit (HOSPITAL_BASED_OUTPATIENT_CLINIC_OR_DEPARTMENT_OTHER): Payer: Self-pay

## 2023-05-23 MED ORDER — ROSUVASTATIN CALCIUM 5 MG PO TABS
5.0000 mg | ORAL_TABLET | Freq: Every day | ORAL | 1 refills | Status: DC
  Filled 2023-07-02: qty 90, 90d supply, fill #0
  Filled 2023-10-02: qty 90, 90d supply, fill #1

## 2023-05-23 MED ORDER — MEMANTINE HCL 10 MG PO TABS
10.0000 mg | ORAL_TABLET | Freq: Two times a day (BID) | ORAL | 1 refills | Status: DC
  Filled 2023-07-02 – 2023-08-06 (×3): qty 180, 90d supply, fill #0

## 2023-05-23 MED ORDER — DONEPEZIL HCL 10 MG PO TABS
10.0000 mg | ORAL_TABLET | Freq: Every day | ORAL | 1 refills | Status: DC
  Filled 2023-07-02 – 2023-08-06 (×3): qty 90, 90d supply, fill #0

## 2023-05-23 MED ORDER — TIZANIDINE HCL 4 MG PO TABS
4.0000 mg | ORAL_TABLET | Freq: Every day | ORAL | 1 refills | Status: DC
  Filled 2023-09-03: qty 90, 90d supply, fill #0

## 2023-05-23 NOTE — Telephone Encounter (Signed)
Partner says Namzaric is non-formulary. Will chante to aricept plus namenda. Refills provided as requested.

## 2023-05-30 ENCOUNTER — Telehealth: Payer: Self-pay | Admitting: Family

## 2023-05-30 NOTE — Telephone Encounter (Signed)
Prescription Request  05/30/2023  Is this a "Controlled Substance" medicine? No  LOV: 03/28/2023  What is the name of the medication or equipment? mirtazapine (REMERON) 15 MG tablet   Have you contacted your pharmacy to request a refill? No   Which pharmacy would you like this sent to?    [facility] [address] [place] [address] Phone: [phone] Fax: [phone]    Patient notified that their request is being sent to the clinical staff for review and that they should receive a response within 2 business days.   *PT did request it to be done by tomorrow if at all possible.*   Please advise at Mobile [phone] (mobile)

## 2023-05-31 ENCOUNTER — Other Ambulatory Visit (HOSPITAL_BASED_OUTPATIENT_CLINIC_OR_DEPARTMENT_OTHER): Payer: Self-pay

## 2023-05-31 MED ORDER — MIRTAZAPINE 15 MG PO TABS
15.0000 mg | ORAL_TABLET | Freq: Every day | ORAL | 1 refills | Status: DC
  Filled 2023-07-02: qty 30, 30d supply, fill #0

## 2023-05-31 NOTE — Addendum Note (Signed)
Addended by: Sandford Craze on: 05/31/2023 06:06 PM   Modules accepted: Orders

## 2023-05-31 NOTE — Telephone Encounter (Signed)
Eber Jones (spouse DPR Ok) called to follow up on medication status. She stated that pt has been without this medication for several days and needs to see about it being called in.

## 2023-05-31 NOTE — Telephone Encounter (Signed)
Rx has been sent to [facility]

## 2023-06-01 NOTE — Telephone Encounter (Signed)
Patient's care giver notified rx has been sent

## 2023-07-02 ENCOUNTER — Other Ambulatory Visit: Payer: Self-pay

## 2023-07-02 ENCOUNTER — Other Ambulatory Visit (HOSPITAL_COMMUNITY): Payer: Self-pay

## 2023-07-02 ENCOUNTER — Telehealth: Payer: Self-pay | Admitting: Family

## 2023-07-02 ENCOUNTER — Other Ambulatory Visit (HOSPITAL_BASED_OUTPATIENT_CLINIC_OR_DEPARTMENT_OTHER): Payer: Self-pay

## 2023-07-02 DIAGNOSIS — M810 Age-related osteoporosis without current pathological fracture: Secondary | ICD-10-CM

## 2023-07-02 MED ORDER — DENOSUMAB 60 MG/ML ~~LOC~~ SOSY
60.0000 mg | PREFILLED_SYRINGE | Freq: Once | SUBCUTANEOUS | Status: AC
Start: 2023-07-16 — End: ?

## 2023-07-02 MED ORDER — TIZANIDINE HCL 4 MG PO TABS
4.0000 mg | ORAL_TABLET | Freq: Every evening | ORAL | 2 refills | Status: DC
  Filled 2023-07-02 – 2023-08-02 (×2): qty 30, 30d supply, fill #0

## 2023-07-02 NOTE — Telephone Encounter (Signed)
 CAM has been placed.  Pt is not new to prolia, but is switching from having it at Dr Irish Lack office to our office.

## 2023-07-02 NOTE — Telephone Encounter (Signed)
 Please initiate Prolia authorization. His next dose is due 08/19/23.

## 2023-07-03 ENCOUNTER — Other Ambulatory Visit (HOSPITAL_COMMUNITY): Payer: Self-pay

## 2023-07-03 ENCOUNTER — Telehealth: Payer: Self-pay

## 2023-07-03 NOTE — Telephone Encounter (Signed)
 Prolia VOB initiated via [email or web address]  Next Prolia inj DUE: 08/19/23

## 2023-07-06 ENCOUNTER — Other Ambulatory Visit (HOSPITAL_BASED_OUTPATIENT_CLINIC_OR_DEPARTMENT_OTHER): Payer: Self-pay

## 2023-07-10 ENCOUNTER — Ambulatory Visit (INDEPENDENT_AMBULATORY_CARE_PROVIDER_SITE_OTHER): Payer: Medicare PPO | Admitting: Family

## 2023-07-10 ENCOUNTER — Other Ambulatory Visit (HOSPITAL_BASED_OUTPATIENT_CLINIC_OR_DEPARTMENT_OTHER): Payer: Self-pay

## 2023-07-10 VITALS — BP 160/88 | HR 58 | Temp 98.5°F | Resp 16 | Ht 74.0 in | Wt 176.0 lb

## 2023-07-10 DIAGNOSIS — R634 Abnormal weight loss: Secondary | ICD-10-CM | POA: Diagnosis not present

## 2023-07-10 DIAGNOSIS — E538 Deficiency of other specified B group vitamins: Secondary | ICD-10-CM | POA: Insufficient documentation

## 2023-07-10 DIAGNOSIS — F03B0 Unspecified dementia, moderate, without behavioral disturbance, psychotic disturbance, mood disturbance, and anxiety: Secondary | ICD-10-CM

## 2023-07-10 DIAGNOSIS — E559 Vitamin D deficiency, unspecified: Secondary | ICD-10-CM | POA: Diagnosis not present

## 2023-07-10 DIAGNOSIS — R739 Hyperglycemia, unspecified: Secondary | ICD-10-CM

## 2023-07-10 DIAGNOSIS — I10 Essential (primary) hypertension: Secondary | ICD-10-CM

## 2023-07-10 DIAGNOSIS — J309 Allergic rhinitis, unspecified: Secondary | ICD-10-CM | POA: Insufficient documentation

## 2023-07-10 HISTORY — DX: Allergic rhinitis, unspecified: J30.9

## 2023-07-10 HISTORY — DX: Deficiency of other specified B group vitamins: E53.8

## 2023-07-10 MED ORDER — FLUTICASONE PROPIONATE 50 MCG/ACT NA SUSP: 2.0000 | Freq: Every day | NASAL | Status: AC

## 2023-07-10 MED ORDER — MIRTAZAPINE 15 MG PO TABS
15.0000 mg | ORAL_TABLET | Freq: Every day | ORAL | 1 refills | Status: DC
  Filled 2023-07-10 – 2023-08-02 (×2): qty 30, 30d supply, fill #0
  Filled 2023-09-03: qty 30, 30d supply, fill #1

## 2023-07-10 MED ORDER — AMLODIPINE BESYLATE 5 MG PO TABS
5.0000 mg | ORAL_TABLET | Freq: Every day | ORAL | 1 refills | Status: DC
  Filled 2023-07-10: qty 90, 90d supply, fill #0
  Filled 2023-10-09: qty 90, 90d supply, fill #1

## 2023-07-10 MED ORDER — CETIRIZINE HCL 10 MG PO TABS: 10.0000 mg | ORAL_TABLET | Freq: Every day | ORAL | Status: AC

## 2023-07-10 NOTE — Assessment & Plan Note (Signed)
  Complaints of runny nose despite taking cetirizine (Allertec). -Add Flonase nasal spray, two sprays to each nostril daily.

## 2023-07-10 NOTE — Assessment & Plan Note (Signed)
 Will update vit D level.

## 2023-07-10 NOTE — Assessment & Plan Note (Signed)
 Now on oral b12 supplement. Update b12 level.

## 2023-07-10 NOTE — Patient Instructions (Signed)
 VISIT SUMMARY:  During today's visit, we discussed your recent dizziness, weight loss, runny nose, and medication management. We also reviewed your general health maintenance and made some adjustments to your treatment plan.  YOUR PLAN:  -UNINTENTIONAL WEIGHT LOSS: You have lost 6 pounds recently without significant changes in your appetite or diet. We will monitor your weight weekly and ask that you notify us  if your weight continues to decrease.  -ALLERGIC RHINITIS: Allergic rhinitis is inflammation of the inside of your nose caused by an allergen, such as pollen, dust, or mold. Since cetirizine  (Allertec) is not fully relieving your runny nose, we are adding Flonase  nasal spray. Please use two sprays in each nostril daily.  -VITAMIN D  DEFICIENCY: Vitamin D  deficiency occurs when you do not have enough vitamin D  in your body, which is important for bone health. We will check your Vitamin D  level today and may consider adding a Vitamin D  supplement based on the results.  -MEDICATION MANAGEMENT: We discussed your medications to ensure you are taking them correctly. We have refilled your mirtazapine  15mg  for 30 days and will check on a 90-day refill for tizanidine  4mg . A printed medication list will be provided for you and your caregiver to review.  -HYPERTENSION- we are starting amlodipine  today for your blood pressure.  -GENERAL HEALTH MAINTENANCE: Continue taking your current medications, including rosuvastatin , donazepril, memantine , and tizanidine . Make sure you are taking your B12 supplement as previously prescribed. We may add a Vitamin D  supplement depending on your lab results.  INSTRUCTIONS:  Please monitor your weight weekly and notify us  if it continues to decrease. Use Flonase  nasal spray as directed for your runny nose. We will check your Vitamin D  level today, and you may need to start a supplement based on the results. Review the printed medication list with your caregiver to  ensure proper dosages and refill schedules.

## 2023-07-10 NOTE — Progress Notes (Signed)
 Subjective:     Patient ID: Steven Doyle, male    DOB: 1934/03/24, 89 y.o.   MRN: [id number]  Chief Complaint  Patient presents with   Hypertension    Here for follow up    HPI  Discussed the use of AI scribe software for clinical note transcription with the patient, who gave verbal consent to proceed.  History of Present Illness   The patient, with a history of osteoporosis and dementia presents for follow up. He also reports a 6-pound weight loss, which he attributes to a decreased appetite. He has noticed that his pants and watch band are looser. The patient's spouse confirms that the patient has been eating less. The patient also complains of intermittent lower back pain, which he believes is weather-related.  In addition, the patient has been experiencing a runny nose. He has been taking Allertec (cetirizine ) for this symptom, but it does not seem to be helping much. The patient's spouse inquires about other potential allergy medications.       Wt Readings from Last 3 Encounters:  07/10/23 176 lb (79.8 kg)  04/17/23 182 lb (82.6 kg)  03/28/23 182 lb (82.6 kg)   BP Readings from Last 3 Encounters:  07/10/23 (!) 160/88  04/17/23 (!) 153/82  03/28/23 139/77      Health Maintenance Due  Topic Date Due   COVID-19 Vaccine (8 - 2024-25 season) 05/14/2023   Zoster Vaccines- Shingrix (2 of 2) 05/23/2023    Past Medical History:  Diagnosis Date   Compression fracture of fifth lumbar vertebra (HCC)    Coronary artery disease    Dementia (HCC)    Derangement of posterior horn of medial meniscus 12/03/2013   History of diverticulitis    History of herpes zoster 02/16/2014   Hypercholesterolemia    Memory loss    Pulmonary embolus (HCC)     Past Surgical History:  Procedure Laterality Date   CARDIAC SURGERY     2008 bypass   CORONARY ARTERY BYPASS GRAFT  2008   x 7   HYDROCELE EXCISION / REPAIR     IR KYPHO EA ADDL LEVEL THORACIC OR LUMBAR  08/06/2018   IR KYPHO  EA ADDL LEVEL THORACIC OR LUMBAR  08/06/2018   IR KYPHO EA ADDL LEVEL THORACIC OR LUMBAR  10/01/2018   IR KYPHO EA ADDL LEVEL THORACIC OR LUMBAR  10/01/2018   IR KYPHO LUMBAR INC FX REDUCE BONE BX UNI/BIL CANNULATION INC/IMAGING  08/06/2018   IR KYPHO LUMBAR INC FX REDUCE BONE BX UNI/BIL CANNULATION INC/IMAGING  10/01/2018   IR RADIOLOGIST EVAL & MGMT  07/31/2018   IR RADIOLOGIST EVAL & MGMT  08/28/2018   PROSTATE SURGERY     ROTATOR CUFF REPAIR      Family History  Problem Relation Age of Onset   Arthritis/Rheumatoid Mother        died from study drug   Breast cancer Sister 59   Heart disease Brother    Stomach cancer Other     Social History   Socioeconomic History   Marital status: Married    Spouse name: Not on file   Number of children: Not on file   Years of education: Not on file   Highest education level: Not on file  Occupational History   Not on file  Tobacco Use   Smoking status: Former    Types: Cigarettes   Smokeless tobacco: Never   Tobacco comments:    Smoked briefly while in the service for 3  years  Substance and Sexual Activity   Alcohol use: No   Drug use: Never   Sexual activity: Not Currently  Other Topics Concern   Not on file  Social History Narrative   ** Merged History Encounter **       One daughter living estranged but lives locally Lives with male partner Worked with Software Engineer Grew up in [place] Completed some high school He served in the [employer] Has 2 cats Did not really know his father    Social Drivers of Corporate Investment Banker Strain: Not on file  Food Insecurity: Not on file  Transportation Needs: Not on file  Physical Activity: Not on file  Stress: Not on file  Social Connections: Unknown (11/10/2021)   Received from [facility], [facility]   Social Network    Social Network: Not on file  Intimate Partner Violence: Unknown (11/10/2021)   Received from [facility], [facility]   HITS     Physically Hurt: Not on file    Insult or Talk Down To: Not on file    Threaten Physical Harm: Not on file    Scream or Curse: Not on file    Outpatient Medications Prior to Visit  Medication Sig Dispense Refill   Ca Phosphate-Cholecalciferol  (CALCIUM  WITH D3) 250-12.5 MG-MCG CHEW Chew by mouth.     cyanocobalamin  (VITAMIN B12) 1000 MCG tablet Take 1 tablet (1,000 mcg total) by mouth daily.     donepezil  (ARICEPT ) 10 MG tablet Take 1 tablet (10 mg total) by mouth at bedtime. 90 tablet 1   memantine  (NAMENDA ) 10 MG tablet Take 1 tablet (10 mg total) by mouth 2 (two) times daily. 180 tablet 1   rosuvastatin  (CRESTOR ) 5 MG tablet Take 1 tablet (5 mg total) by mouth daily at 6 PM. 90 tablet 1   tiZANidine  (ZANAFLEX ) 4 MG tablet Take 1 tablet (4 mg total) by mouth at bedtime. 90 tablet 1   tiZANidine  (ZANAFLEX ) 4 MG tablet Take 1 tablet (4 mg total) by mouth every evening. 30 tablet 2   mirtazapine  (REMERON ) 15 MG tablet Take 1 tablet (15 mg total) by mouth at bedtime. 30 tablet 1   Facility-Administered Medications Prior to Visit  Medication Dose Route Frequency Provider Last Rate Last Admin   [START ON 07/16/2023] denosumab  (PROLIA ) injection 60 mg  60 mg Subcutaneous Once O'Sullivan, Liviah Cake, NP        Allergies  Allergen Reactions   Atorvastatin Other (See Comments)    Muscle aches   Sulfa Antibiotics Swelling   Terazosin Swelling    ROS See HPI    Objective:    Physical Exam Constitutional:      General: He is not in acute distress.    Appearance: He is well-developed.  HENT:     Head: Normocephalic and atraumatic.  Cardiovascular:     Rate and Rhythm: Normal rate and regular rhythm.     Heart sounds: No murmur heard. Pulmonary:     Effort: Pulmonary effort is normal. No respiratory distress.     Breath sounds: Normal breath sounds. No wheezing or rales.  Skin:    General: Skin is warm and dry.  Neurological:     Mental Status: He is alert.     Comments: Confused,  at baseline  Psychiatric:        Behavior: Behavior normal.        Thought Content: Thought content normal.      BP (!) 160/88  Pulse (!) 58   Temp 98.5 F (36.9 C) (Oral)   Resp 16   Ht 6' 2 (1.88 m)   Wt 176 lb (79.8 kg)   SpO2 96%   BMI 22.60 kg/m  Wt Readings from Last 3 Encounters:  07/10/23 176 lb (79.8 kg)  04/17/23 182 lb (82.6 kg)  03/28/23 182 lb (82.6 kg)       Assessment & Plan:   Problem List Items Addressed This Visit       Unprioritized   Weight loss    6 pound weight loss noted. No significant changes in appetite or diet reported. -Monitor weight weekly at home. -Notify provider if weight continues to decrease.       Relevant Orders   TSH   Vitamin D  deficiency   Will update vit D level.       Relevant Orders   Vitamin D  (25 hydroxy)   Essential hypertension   Uncontrolled. Add amlodipine  5mg .       Relevant Medications   amLODipine  (NORVASC ) 5 MG tablet   Dementia (HCC)   Maintained on aricept /namenda . Followed by neurology. Spouse states that his memory seems to be stable if not a little bit improved.       Relevant Medications   mirtazapine  (REMERON ) 15 MG tablet   B12 deficiency - Primary   Now on oral b12 supplement. Update b12 level.       Relevant Orders   B12   Allergic rhinitis    Complaints of runny nose despite taking cetirizine  (Allertec). -Add Flonase  nasal spray, two sprays to each nostril daily.      Relevant Medications   fluticasone  (FLONASE ) 50 MCG/ACT nasal spray   Other Visit Diagnoses       Hyperglycemia       Relevant Orders   Basic Metabolic Panel (BMET)   HgB A1c       I am having Jayden Granillo start on fluticasone , cetirizine , and amLODipine . I am also having him maintain his Calcium  with D3, cyanocobalamin , tiZANidine , rosuvastatin , donepezil , memantine , tiZANidine , and mirtazapine . We will continue to administer denosumab .  Meds ordered this encounter  Medications   fluticasone   (FLONASE ) 50 MCG/ACT nasal spray    Sig: Place 2 sprays into both nostrils daily.    Supervising Provider:   DOMENICA BLACKBIRD A [[id number]]   cetirizine  (ZYRTEC ) 10 MG tablet    Sig: Take 1 tablet (10 mg total) by mouth daily.    Supervising Provider:   DOMENICA BLACKBIRD A [[id number]]   mirtazapine  (REMERON ) 15 MG tablet    Sig: Take 1 tablet (15 mg total) by mouth at bedtime.    Dispense:  30 tablet    Refill:  1    Supervising Provider:   DOMENICA BLACKBIRD A [[id number]]   amLODipine  (NORVASC ) 5 MG tablet    Sig: Take 1 tablet (5 mg total) by mouth daily.    Dispense:  90 tablet    Refill:  1    Supervising Provider:   DOMENICA BLACKBIRD A [[id number]]

## 2023-07-10 NOTE — Assessment & Plan Note (Signed)
 Uncontrolled. Add amlodipine 5mg .

## 2023-07-10 NOTE — Assessment & Plan Note (Signed)
  6 pound weight loss noted. No significant changes in appetite or diet reported. -Monitor weight weekly at home. -Notify provider if weight continues to decrease.

## 2023-07-10 NOTE — Assessment & Plan Note (Signed)
 Maintained on aricept/namenda. Followed by neurology. Spouse states that his memory seems to be stable if not a little bit improved.

## 2023-07-11 ENCOUNTER — Encounter: Payer: Self-pay | Admitting: Family

## 2023-07-11 LAB — BASIC METABOLIC PANEL
BUN: 23 mg/dL (ref 6–23)
CO2: 31 meq/L (ref 19–32)
Calcium: 9.4 mg/dL (ref 8.4–10.5)
Chloride: 104 meq/L (ref 96–112)
Creatinine, Ser: 1.11 mg/dL (ref 0.40–1.50)
GFR: 59.52 mL/min — ABNORMAL LOW (ref >60.00)
Glucose, Bld: 68 mg/dL — ABNORMAL LOW (ref 70–99)
Potassium: 3.9 meq/L (ref 3.5–5.1)
Sodium: 141 meq/L (ref 135–145)

## 2023-07-11 LAB — HEMOGLOBIN A1C: Hgb A1c MFr Bld: 5.6 % (ref 4.6–6.5)

## 2023-07-11 LAB — TSH: TSH: 1.86 u[IU]/mL (ref 0.35–5.50)

## 2023-07-11 LAB — VITAMIN D 25 HYDROXY (VIT D DEFICIENCY, FRACTURES): VITD: 33.8 ng/mL (ref 30.00–100.00)

## 2023-07-11 LAB — VITAMIN B12: Vitamin B-12: 293 pg/mL (ref 211–911)

## 2023-07-12 ENCOUNTER — Telehealth: Payer: Self-pay | Admitting: Family

## 2023-07-12 ENCOUNTER — Encounter: Payer: Self-pay | Admitting: Family

## 2023-07-12 NOTE — Telephone Encounter (Signed)
Please contact spouse and advise her that Steven Doyle's b12 has only come up a little bit with the b12 pills.  I would recommend that he begin b12 injections 1000 mcg IM weekly x 4 then monthly.

## 2023-07-12 NOTE — Telephone Encounter (Signed)
Information given to patient's spouse Mrs. Hager. Patient was scheduled to start B12 injections next week

## 2023-07-17 ENCOUNTER — Ambulatory Visit (INDEPENDENT_AMBULATORY_CARE_PROVIDER_SITE_OTHER): Payer: Medicare PPO

## 2023-07-17 DIAGNOSIS — E538 Deficiency of other specified B group vitamins: Secondary | ICD-10-CM

## 2023-07-17 MED ORDER — CYANOCOBALAMIN 1000 MCG/ML IJ SOLN
1000.0000 ug | Freq: Once | INTRAMUSCULAR | Status: AC
  Administered 2023-07-17: 1000 ug via INTRAMUSCULAR

## 2023-07-17 NOTE — Progress Notes (Signed)
Steven Doyle is a 89 y.o. male presents to the office today for 1/4 weekly B12 injection, per physician's orders. Original order: "07/12/23: Please contact spouse and advise her that [name]'s b12 has only come up a little bit with the b12 pills. I would recommend that he begin b12 injections 1000 mcg IM weekly x 4 then monthly." Cyanocobalamin 1000 mg/ml IM was administered R Deltoid today. Patient tolerated injection. Patient due for follow up labs/provider appt: No.  Patient next injection due: 1 week, appt made Yes     Creft, Melton Alar L

## 2023-07-23 NOTE — Progress Notes (Deleted)
Steven Doyle is a 89 y.o. male presents to the office today for 2/4 weekly B12 injection, per physician's orders. Original order: "07/12/23: Please contact spouse and advise her that Lula's b12 has only come up a little bit with the b12 pills. I would recommend that he begin b12 injections 1000 mcg IM weekly x 4 then monthly."  Cyanocobalamin 1000 mg/ml IM was administered *** Deltoid today. Patient tolerated injection. Patient due for follow up labs/provider appt: No.  Patient next injection due: 1 week for 3/4 weekly B12 injection, appt made {yes/no:[id number]}  Creft, Melton Alar L

## 2023-07-24 ENCOUNTER — Ambulatory Visit (INDEPENDENT_AMBULATORY_CARE_PROVIDER_SITE_OTHER): Payer: Medicare PPO

## 2023-07-24 ENCOUNTER — Ambulatory Visit: Payer: Medicare PPO

## 2023-07-24 DIAGNOSIS — E538 Deficiency of other specified B group vitamins: Secondary | ICD-10-CM

## 2023-07-24 MED ORDER — CYANOCOBALAMIN 1000 MCG/ML IJ SOLN
1000.0000 ug | Freq: Once | INTRAMUSCULAR | Status: AC
  Administered 2023-07-24: 1000 ug via INTRAMUSCULAR

## 2023-07-24 NOTE — Progress Notes (Signed)
Steven Doyle is a 89 y.o. male presents to the office today for 2/4 weekly B12 injection, per physician's orders. Original order: "07/12/23: Please contact spouse and advise her that Steven Doyle b12 has only come up a little bit with the b12 pills. I would recommend that he begin b12 injections 1000 mcg IM weekly x 4 then monthly."  Cyanocobalamin 1000 mg/ml IM was administered R Deltoid today. Patient tolerated injection. Patient due for follow up labs/provider appt: No.  Patient next injection due: 1 week for 3/4 weekly B12 injection, appt made Yes- already pending  Steven Doyle, Steven Doyle

## 2023-07-31 ENCOUNTER — Ambulatory Visit (INDEPENDENT_AMBULATORY_CARE_PROVIDER_SITE_OTHER): Payer: Medicare PPO

## 2023-07-31 DIAGNOSIS — E538 Deficiency of other specified B group vitamins: Secondary | ICD-10-CM

## 2023-07-31 MED ORDER — CYANOCOBALAMIN 1000 MCG/ML IJ SOLN
1000.0000 ug | Freq: Once | INTRAMUSCULAR | Status: AC
  Administered 2023-07-31: 1000 ug via INTRAMUSCULAR

## 2023-07-31 NOTE — Progress Notes (Signed)
 Steven Doyle is a 89 y.o. male presents to the office today for 3/4 weekly B12 injection, per physician's orders. Original order: 07/12/23: Please contact spouse and advise her that Steven Doyle's b12 has only come up a little bit with the b12 pills. I would recommend that he begin b12 injections 1000 mcg IM weekly x 4 then monthly.  Cyanocobalamin  1000 mg/ml IM was administered L Deltoid today. Patient tolerated injection. Patient due for follow up labs/provider appt: No.  Patient next injection due: 1 week for 4/4 weekly B12 injection, appt made Yes- already pending    Creft, Candelaria L

## 2023-08-02 ENCOUNTER — Other Ambulatory Visit: Payer: Self-pay

## 2023-08-02 ENCOUNTER — Other Ambulatory Visit (HOSPITAL_BASED_OUTPATIENT_CLINIC_OR_DEPARTMENT_OTHER): Payer: Self-pay

## 2023-08-06 ENCOUNTER — Other Ambulatory Visit (HOSPITAL_BASED_OUTPATIENT_CLINIC_OR_DEPARTMENT_OTHER): Payer: Self-pay

## 2023-08-07 ENCOUNTER — Ambulatory Visit: Payer: Medicare PPO | Admitting: Family

## 2023-08-07 ENCOUNTER — Other Ambulatory Visit (HOSPITAL_BASED_OUTPATIENT_CLINIC_OR_DEPARTMENT_OTHER): Payer: Self-pay

## 2023-08-07 VITALS — BP 120/64 | HR 55 | Temp 97.8°F | Resp 16 | Ht 74.0 in | Wt 172.0 lb

## 2023-08-07 DIAGNOSIS — E538 Deficiency of other specified B group vitamins: Secondary | ICD-10-CM | POA: Diagnosis not present

## 2023-08-07 DIAGNOSIS — F03B0 Unspecified dementia, moderate, without behavioral disturbance, psychotic disturbance, mood disturbance, and anxiety: Secondary | ICD-10-CM

## 2023-08-07 DIAGNOSIS — I10 Essential (primary) hypertension: Secondary | ICD-10-CM

## 2023-08-07 DIAGNOSIS — R634 Abnormal weight loss: Secondary | ICD-10-CM | POA: Diagnosis not present

## 2023-08-07 DIAGNOSIS — K625 Hemorrhage of anus and rectum: Secondary | ICD-10-CM | POA: Insufficient documentation

## 2023-08-07 HISTORY — DX: Hemorrhage of anus and rectum: K62.5

## 2023-08-07 MED ORDER — CYANOCOBALAMIN 1000 MCG/ML IJ SOLN
1000.0000 ug | Freq: Once | INTRAMUSCULAR | Status: AC
  Administered 2023-08-07: 1000 ug via INTRAMUSCULAR

## 2023-08-07 NOTE — Patient Instructions (Signed)
VISIT SUMMARY:  Today, we reviewed your blood pressure management, weight loss, memory loss, and other health concerns. Your blood pressure has improved with your current medication, and we discussed your ongoing weight loss and memory support. We also addressed your concerns about hemorrhoids and continued your B12 treatment plan.  YOUR PLAN:  -HYPERTENSION: Hypertension means high blood pressure. Your blood pressure has improved with the addition of Amlodipine, and we will continue your current medication regimen.  -VITAMIN B12 DEFICIENCY: Vitamin B12 deficiency can cause fatigue and other symptoms. You have completed your initial weekly B12 injections and will now transition to monthly injections.  -UNINTENTIONAL WEIGHT LOSS: Unintentional weight loss means losing weight without trying. We will order a CT scan of your chest, abdomen, and pelvis to rule out any serious conditions and check your kidney function before the scan.  -HEMORRHOIDS: Hemorrhoids are swollen veins in the lower rectum and anus that can cause bleeding. We will perform a rectal exam to confirm this diagnosis.  -COGNITIVE DECLINE: Cognitive decline means a decrease in memory and thinking skills. You are currently taking Donazepril and Memantadine to help manage this, and we will continue with your current treatment.  INSTRUCTIONS:  Please return in 1 month for your B12 injection with the nurse and in 3 months for a follow-up visit. We will also check your B12 levels at your next visit. Additionally, we will schedule a CT scan of your chest, abdomen, and pelvis and check your kidney function before the scan.

## 2023-08-07 NOTE — Assessment & Plan Note (Signed)
Memory unchanged, continues Aricept and Namenda.

## 2023-08-07 NOTE — Assessment & Plan Note (Signed)
Noted on the tissue only with wiping- likely hemorrhoidal.  Recommeded otc hemorrhoid treatment such as preparation H ointment or suppositories, sitz baths with epsom salts prn. Check CBC.

## 2023-08-07 NOTE — Assessment & Plan Note (Signed)
Will receive 4th b12 weekly then will space to monthly.

## 2023-08-07 NOTE — Progress Notes (Signed)
Subjective:     Patient ID: Steven Doyle, male    DOB: 1934/04/25, 89 y.o.   MRN: [id number]  Chief Complaint  Patient presents with   B12 deficiency    Due for B12 today 4 of 4 weekly.    Hypertension    Here for follow up    HPI  Discussed the use of AI scribe software for clinical note transcription with the patient, who gave verbal consent to proceed.  History of Present Illness   Steven Doyle is an 89 year old male with hypertension and memory loss who presents for follow-up on blood pressure management and weight loss. He is accompanied by [name], his caregiver.  He is currently on amlodipine for hypertension management, which has resulted in improved blood pressure readings, now measuring 120/64 mmHg, down from 160/88 mmHg.  He has experienced a gradual weight loss over the past few years, with his weight decreasing from 245 pounds several years ago to 172 pounds currently. Despite the weight loss, he maintains a good appetite. His weight was stable at 182 pounds in October, consistent for about six months prior. The weight loss trajectory has been from 192 pounds to 187 pounds, and then to 182 pounds over the past three years.  He has a history of memory loss and is currently taking donepezil and memantine to help prevent further decline.  He has noticed blood on toilet tissue, which he attributes to hemorrhoids. He describes the presence of 'hemorrhoid bulbs' and notes that the bleeding is not excessive. No abdominal pain is reported, but he mentions experiencing pain below the belt in the back.  He is receiving B12 injections, with today being his fourth weekly injection. Following this, the plan is to transition to monthly injections. He does not require any over-the-counter B12 supplementation at this time.     Wt Readings from Last 3 Encounters:  08/07/23 172 lb (78 kg)  07/10/23 176 lb (79.8 kg)  04/17/23 182 lb (82.6 kg)         Health Maintenance Due  Topic  Date Due   COVID-19 Vaccine (8 - 2024-25 season) 05/14/2023   Zoster Vaccines- Shingrix (2 of 2) 05/23/2023    Past Medical History:  Diagnosis Date   Compression fracture of fifth lumbar vertebra (HCC)    Coronary artery disease    Dementia (HCC)    Derangement of posterior horn of medial meniscus 12/03/2013   History of diverticulitis    History of herpes zoster 02/16/2014   Hypercholesterolemia    Memory loss    Pulmonary embolus (HCC)     Past Surgical History:  Procedure Laterality Date   CARDIAC SURGERY     2008 bypass   CORONARY ARTERY BYPASS GRAFT  2008   x 7   HYDROCELE EXCISION / REPAIR     IR KYPHO EA ADDL LEVEL THORACIC OR LUMBAR  08/06/2018   IR KYPHO EA ADDL LEVEL THORACIC OR LUMBAR  08/06/2018   IR KYPHO EA ADDL LEVEL THORACIC OR LUMBAR  10/01/2018   IR KYPHO EA ADDL LEVEL THORACIC OR LUMBAR  10/01/2018   IR KYPHO LUMBAR INC FX REDUCE BONE BX UNI/BIL CANNULATION INC/IMAGING  08/06/2018   IR KYPHO LUMBAR INC FX REDUCE BONE BX UNI/BIL CANNULATION INC/IMAGING  10/01/2018   IR RADIOLOGIST EVAL & MGMT  07/31/2018   IR RADIOLOGIST EVAL & MGMT  08/28/2018   PROSTATE SURGERY     ROTATOR CUFF REPAIR      Family History  Problem  Relation Age of Onset   Arthritis/Rheumatoid Mother        died from study drug   Breast cancer Sister 33   Heart disease Brother    Stomach cancer Other     Social History   Socioeconomic History   Marital status: Married    Spouse name: Not on file   Number of children: Not on file   Years of education: Not on file   Highest education level: Not on file  Occupational History   Not on file  Tobacco Use   Smoking status: Former    Types: Cigarettes   Smokeless tobacco: Never   Tobacco comments:    Smoked briefly while in the service for 3 years  Substance and Sexual Activity   Alcohol use: No   Drug use: Never   Sexual activity: Not Currently  Other Topics Concern   Not on file  Social History Narrative   ** Merged  History Encounter **       One daughter living estranged but lives locally Lives with male partner Worked with Software engineer Grew up in [place] Completed some high school He served in the [employer] Has 2 cats Did not really know his father    Social Drivers of Corporate investment banker Strain: Not on file  Food Insecurity: Not on file  Transportation Needs: Not on file  Physical Activity: Not on file  Stress: Not on file  Social Connections: Unknown (11/10/2021)   Received from [facility], [facility]   Social Network    Social Network: Not on file  Intimate Partner Violence: Unknown (11/10/2021)   [facility], [facility]   HITS    Physically Hurt: Not on file    Insult or Talk Down To: Not on file    Threaten Physical Harm: Not on file    Scream or Curse: Not on file    Outpatient Medications Prior to Visit  Medication Sig Dispense Refill   amLODipine (NORVASC) 5 MG tablet Take 1 tablet (5 mg total) by mouth daily. 90 tablet 1   Ca Phosphate-Cholecalciferol (CALCIUM WITH D3) 250-12.5 MG-MCG CHEW Chew by mouth.     cetirizine (ZYRTEC) 10 MG tablet Take 1 tablet (10 mg total) by mouth daily.     cyanocobalamin (VITAMIN B12) 1000 MCG tablet Take 1 tablet (1,000 mcg total) by mouth daily.     donepezil (ARICEPT) 10 MG tablet Take 1 tablet (10 mg total) by mouth at bedtime. 90 tablet 1   fluticasone (FLONASE) 50 MCG/ACT nasal spray Place 2 sprays into both nostrils daily.     memantine (NAMENDA) 10 MG tablet Take 1 tablet (10 mg total) by mouth 2 (two) times daily. 180 tablet 1   mirtazapine (REMERON) 15 MG tablet Take 1 tablet (15 mg total) by mouth at bedtime. 30 tablet 1   rosuvastatin (CRESTOR) 5 MG tablet Take 1 tablet (5 mg total) by mouth daily at 6 PM. 90 tablet 1   tiZANidine (ZANAFLEX) 4 MG tablet Take 1 tablet (4 mg total) by mouth at bedtime. 90 tablet 1   tiZANidine (ZANAFLEX) 4 MG tablet Take 1 tablet (4 mg total) by mouth  every evening. 30 tablet 2   Facility-Administered Medications Prior to Visit  Medication Dose Route Frequency Provider Last Rate Last Admin   denosumab (PROLIA) injection 60 mg  60 mg Subcutaneous Once Sandford Craze, NP        Allergies  Allergen Reactions  Atorvastatin Other (See Comments)    Muscle aches   Sulfa Antibiotics Swelling   Terazosin Swelling    ROS See HPI    Objective:    Physical Exam Exam conducted with a chaperone present.  Constitutional:      General: He is not in acute distress.    Appearance: He is well-developed.  HENT:     Head: Normocephalic and atraumatic.  Cardiovascular:     Rate and Rhythm: Normal rate and regular rhythm.     Heart sounds: No murmur heard. Pulmonary:     Effort: Pulmonary effort is normal. No respiratory distress.     Breath sounds: Normal breath sounds. No wheezing or rales.  Genitourinary:    Comments: Several hemorrhoids noted one outside of the anus and one more internal Skin:    General: Skin is warm and dry.  Neurological:     Mental Status: He is alert and oriented to person, place, and time.  Psychiatric:        Behavior: Behavior normal.        Thought Content: Thought content normal.      BP 120/64 (BP Location: Right Arm, Patient Position: Sitting, Cuff Size: Normal)   Pulse (!) 55   Temp 97.8 F (36.6 C) (Oral)   Resp 16   Ht 6\' 2"  (1.88 m)   Wt 172 lb (78 kg)   SpO2 98%   BMI 22.08 kg/m  Wt Readings from Last 3 Encounters:  08/07/23 172 lb (78 kg)  07/10/23 176 lb (79.8 kg)  04/17/23 182 lb (82.6 kg)       Assessment & Plan:   Problem List Items Addressed This Visit       Unprioritized   Unintended weight loss   Gradual progressive weight loss.  Will obtain CT chest/abd/pelvis for further evaluation to exclude underlying malignancy.  Wt Readings from Last 3 Encounters:  08/07/23 172 lb (78 kg)  07/10/23 176 lb (79.8 kg)  04/17/23 182 lb (82.6 kg)         Relevant Orders    CT Chest Wo Contrast   CT ABDOMEN PELVIS W CONTRAST   Basic Metabolic Panel (BMET)   Rectal bleeding   Noted on the tissue only with wiping- likely hemorrhoidal.  Recommeded otc hemorrhoid treatment such as preparation H ointment or suppositories, sitz baths with epsom salts prn. Check CBC.      Relevant Orders   CBC w/Diff   Essential hypertension   BP is much better with the addition of amlodipine. Continue same.       Dementia (HCC)   Memory unchanged, continues Aricept and Namenda.      B12 deficiency - Primary   Will receive 4th b12 weekly then will space to monthly.         I am having [name] maintain his Calcium with D3, cyanocobalamin, tiZANidine, rosuvastatin, donepezil, memantine, tiZANidine, fluticasone, cetirizine, mirtazapine, and amLODipine. We administered cyanocobalamin. We will continue to administer denosumab.  Meds ordered this encounter  Medications   cyanocobalamin (VITAMIN B12) injection 1,000 mcg

## 2023-08-07 NOTE — Assessment & Plan Note (Signed)
BP is much better with the addition of amlodipine. Continue same.

## 2023-08-07 NOTE — Assessment & Plan Note (Signed)
Gradual progressive weight loss.  Will obtain CT chest/abd/pelvis for further evaluation to exclude underlying malignancy.  Wt Readings from Last 3 Encounters:  08/07/23 172 lb (78 kg)  07/10/23 176 lb (79.8 kg)  04/17/23 182 lb (82.6 kg)

## 2023-08-08 LAB — CBC WITH DIFFERENTIAL/PLATELET
Basophils Absolute: 0 10*3/uL (ref 0.0–0.1)
Basophils Relative: 0.9 % (ref 0.0–3.0)
Eosinophils Absolute: 0.2 10*3/uL (ref 0.0–0.7)
Eosinophils Relative: 4.5 % (ref 0.0–5.0)
HCT: 43.5 % (ref 39.0–52.0)
Hemoglobin: 14.8 g/dL (ref 13.0–17.0)
Lymphocytes Relative: 23.7 % (ref 12.0–46.0)
Lymphs Abs: 1.1 10*3/uL (ref 0.7–4.0)
MCHC: 34.1 g/dL (ref 30.0–36.0)
MCV: 92.1 fL (ref 78.0–100.0)
Monocytes Absolute: 0.2 10*3/uL (ref 0.1–1.0)
Monocytes Relative: 5.2 % (ref 3.0–12.0)
Neutro Abs: 2.9 10*3/uL (ref 1.4–7.7)
Neutrophils Relative %: 65.7 % (ref 43.0–77.0)
Platelets: 122 10*3/uL — ABNORMAL LOW (ref 150.0–400.0)
RBC: 4.73 Mil/uL (ref 4.22–5.81)
RDW: 14.3 % (ref 11.5–15.5)
WBC: 4.5 10*3/uL (ref 4.0–10.5)

## 2023-08-08 LAB — BASIC METABOLIC PANEL
BUN: 21 mg/dL (ref 6–23)
CO2: 31 meq/L (ref 19–32)
Calcium: 9.2 mg/dL (ref 8.4–10.5)
Chloride: 104 meq/L (ref 96–112)
Creatinine, Ser: 1.14 mg/dL (ref 0.40–1.50)
GFR: 57.61 mL/min — ABNORMAL LOW (ref >60.00)
Glucose, Bld: 80 mg/dL (ref 70–99)
Potassium: 4.1 meq/L (ref 3.5–5.1)
Sodium: 142 meq/L (ref 135–145)

## 2023-08-08 NOTE — Addendum Note (Signed)
Addended by: Sandford Craze on: 08/08/2023 12:01 PM   Modules accepted: Orders

## 2023-08-13 ENCOUNTER — Ambulatory Visit (INDEPENDENT_AMBULATORY_CARE_PROVIDER_SITE_OTHER): Payer: Medicare PPO

## 2023-08-13 DIAGNOSIS — R634 Abnormal weight loss: Secondary | ICD-10-CM

## 2023-08-13 MED ORDER — IOHEXOL 300 MG/ML  SOLN
100.0000 mL | Freq: Once | INTRAMUSCULAR | Status: AC | PRN
  Administered 2023-08-13: 100 mL via INTRAVENOUS

## 2023-08-16 ENCOUNTER — Telehealth: Payer: Self-pay | Admitting: Family

## 2023-08-16 DIAGNOSIS — N2889 Other specified disorders of kidney and ureter: Secondary | ICD-10-CM

## 2023-08-16 DIAGNOSIS — Z125 Encounter for screening for malignant neoplasm of prostate: Secondary | ICD-10-CM

## 2023-08-16 NOTE — Telephone Encounter (Signed)
Patient's spouse [name] notified of all this information. She will like for him to have [place] at [facility] location, order was changed.  He is scheduled to come in for PSA 08/21/23.

## 2023-08-16 NOTE — Telephone Encounter (Signed)
Please advise pt's spouse [name] that there is a likely cyst on his kidney and I would like for him to complete a renal ultrasound to further evaluate.  There is some enlargement of his prostate and I would like for him to complete a PSA blood test as well.    Maybe we can coordinate the [place] and lab visit appointments.   Also,  he has hernias in both groins.   At his age, I don't think surgery would be a great idea unless he runs into problems such as abdominal pain, difficulty urinating or nausea/vomiting.  If this occurs please take him to the ER.

## 2023-08-20 ENCOUNTER — Other Ambulatory Visit: Payer: Medicare PPO

## 2023-08-20 ENCOUNTER — Other Ambulatory Visit (INDEPENDENT_AMBULATORY_CARE_PROVIDER_SITE_OTHER): Payer: Medicare PPO

## 2023-08-20 DIAGNOSIS — Z125 Encounter for screening for malignant neoplasm of prostate: Secondary | ICD-10-CM

## 2023-08-21 ENCOUNTER — Encounter: Payer: Self-pay | Admitting: Family

## 2023-08-21 LAB — PSA: PSA: 3.26 ng/mL (ref 0.10–4.00)

## 2023-08-22 ENCOUNTER — Ambulatory Visit: Payer: Medicare PPO

## 2023-08-22 DIAGNOSIS — N2889 Other specified disorders of kidney and ureter: Secondary | ICD-10-CM | POA: Diagnosis not present

## 2023-09-01 ENCOUNTER — Encounter: Payer: Self-pay | Admitting: Family

## 2023-09-03 ENCOUNTER — Other Ambulatory Visit (HOSPITAL_BASED_OUTPATIENT_CLINIC_OR_DEPARTMENT_OTHER): Payer: Self-pay

## 2023-09-04 ENCOUNTER — Other Ambulatory Visit (HOSPITAL_COMMUNITY): Payer: Self-pay

## 2023-09-04 NOTE — Telephone Encounter (Signed)
 Patient daughter Steven Doyle declines the Prolia.  The cost for them would be $387.  She takes Reclast and would like to try and get that instead.  She would like to know side effects of the med.  She has an appt with Tammy tomorrow and she will ask her about side effects (as she has her infusion for Reclast scheduled for 09/07/23)  Can await for Melissa return.

## 2023-09-04 NOTE — Telephone Encounter (Signed)
 Pt ready for scheduling for PROLIA on or after : 09/04/23  Option# 1: Buy/Bill (Office supplied medication)  Out-of-pocket cost due at time of clinic visit: $387  Number of injection/visits approved: 2  Primary: [employer] Prolia co-insurance: 20% Admin fee co-insurance: $55  Secondary: --- Prolia co-insurance:  Admin fee co-insurance:   Medical Benefit Details: Date Benefits were checked: 08/13/23 Deductible: NO/ Coinsurance: 20%/ Admin Fee: $55  Prior Auth: APPROVED PA# [id number] Expiration Date: 07/21/21-06/25/24  # of doses approved:2 ----------------------------------------------------------------------- Option# 2- Med Obtained from pharmacy:  Pharmacy benefit: Copay $1,106.45 (Paid to pharmacy) Admin Fee: $55 (Pay at clinic)  Prior Auth: N/A PA# Expiration Date:   # of doses approved:   If patient wants fill through the pharmacy benefit please send prescription to: [employer], and include estimated need by date in rx notes. Pharmacy will ship medication directly to the office.  Patient NOT eligible for Prolia Copay Card. Copay Card can make patient's cost as little as $25. Link to apply: https://www.amgensupportplus.com/copay  ** This summary of benefits is an estimation of the patient's out-of-pocket cost. Exact cost may very based on individual plan coverage.

## 2023-09-05 ENCOUNTER — Ambulatory Visit: Payer: Medicare PPO

## 2023-09-05 DIAGNOSIS — E538 Deficiency of other specified B group vitamins: Secondary | ICD-10-CM

## 2023-09-05 MED ORDER — CYANOCOBALAMIN 1000 MCG/ML IJ SOLN
1000.0000 ug | Freq: Once | INTRAMUSCULAR | Status: AC
  Administered 2023-09-05: 1000 ug via INTRAMUSCULAR

## 2023-09-05 NOTE — Progress Notes (Signed)
 Pt here for monthly B12 injection per PCP  B12 given IM, R deltoid, and pt tolerated injection well.  Next B12 injection scheduled for 10/03/2023.

## 2023-09-09 NOTE — Telephone Encounter (Signed)
 It looks like Dr. Edmon Crape is already working on getting Reclast approved.

## 2023-09-21 NOTE — Telephone Encounter (Signed)
 General 09/04/2023  3:26 PM Blima Ledger, CMA Spoke with spouse Eber Jones and she stated that medication is too expensive.  Would like to try Reclast.  They will discuss at next visit with PCP

## 2023-10-02 ENCOUNTER — Other Ambulatory Visit (HOSPITAL_BASED_OUTPATIENT_CLINIC_OR_DEPARTMENT_OTHER): Payer: Self-pay

## 2023-10-02 ENCOUNTER — Other Ambulatory Visit: Payer: Self-pay | Admitting: Family

## 2023-10-02 MED ORDER — MIRTAZAPINE 15 MG PO TABS
15.0000 mg | ORAL_TABLET | Freq: Every day | ORAL | 1 refills | Status: DC
  Filled 2023-10-02: qty 90, 90d supply, fill #0

## 2023-10-03 ENCOUNTER — Ambulatory Visit (INDEPENDENT_AMBULATORY_CARE_PROVIDER_SITE_OTHER)

## 2023-10-03 DIAGNOSIS — E538 Deficiency of other specified B group vitamins: Secondary | ICD-10-CM | POA: Diagnosis not present

## 2023-10-03 MED ORDER — CYANOCOBALAMIN 1000 MCG/ML IJ SOLN
1000.0000 ug | Freq: Once | INTRAMUSCULAR | Status: AC
  Administered 2023-10-03: 1000 ug via INTRAMUSCULAR

## 2023-10-03 NOTE — Progress Notes (Signed)
 Pt here for monthly B12 injection per Melissa  B12 given IM, and pt tolerated injection well.  Next B12 injection scheduled for 10/31/23

## 2023-10-09 ENCOUNTER — Other Ambulatory Visit (HOSPITAL_BASED_OUTPATIENT_CLINIC_OR_DEPARTMENT_OTHER): Payer: Self-pay

## 2023-10-10 ENCOUNTER — Telehealth: Payer: Self-pay | Admitting: Family

## 2023-10-10 NOTE — Telephone Encounter (Signed)
 Copied from CRM [id number]. Topic: Medicare AWV >> Oct 10, 2023  9:17 AM Juliana Ocean wrote: Reason for CRM: Called 10/10/2023 to sched AWV - NO VOICEMAIL  Rosalee Collins; Care Guide Ambulatory Clinical Support [place] [facility] Direct Dial: [phone]

## 2023-10-22 DIAGNOSIS — R109 Unspecified abdominal pain: Secondary | ICD-10-CM | POA: Diagnosis not present

## 2023-10-22 DIAGNOSIS — K4090 Unilateral inguinal hernia, without obstruction or gangrene, not specified as recurrent: Secondary | ICD-10-CM | POA: Diagnosis not present

## 2023-10-22 DIAGNOSIS — K56699 Other intestinal obstruction unspecified as to partial versus complete obstruction: Secondary | ICD-10-CM | POA: Diagnosis not present

## 2023-10-22 DIAGNOSIS — K567 Ileus, unspecified: Secondary | ICD-10-CM | POA: Diagnosis not present

## 2023-10-22 DIAGNOSIS — K56609 Unspecified intestinal obstruction, unspecified as to partial versus complete obstruction: Secondary | ICD-10-CM | POA: Diagnosis not present

## 2023-10-22 DIAGNOSIS — K55029 Acute infarction of small intestine, extent unspecified: Secondary | ICD-10-CM | POA: Diagnosis not present

## 2023-10-22 DIAGNOSIS — E785 Hyperlipidemia, unspecified: Secondary | ICD-10-CM | POA: Diagnosis not present

## 2023-10-22 DIAGNOSIS — K4031 Unilateral inguinal hernia, with obstruction, without gangrene, recurrent: Secondary | ICD-10-CM | POA: Diagnosis not present

## 2023-10-22 DIAGNOSIS — Z4682 Encounter for fitting and adjustment of non-vascular catheter: Secondary | ICD-10-CM | POA: Diagnosis not present

## 2023-10-22 DIAGNOSIS — R1032 Left lower quadrant pain: Secondary | ICD-10-CM | POA: Diagnosis not present

## 2023-10-22 DIAGNOSIS — K4030 Unilateral inguinal hernia, with obstruction, without gangrene, not specified as recurrent: Secondary | ICD-10-CM | POA: Diagnosis not present

## 2023-10-22 DIAGNOSIS — Z951 Presence of aortocoronary bypass graft: Secondary | ICD-10-CM | POA: Diagnosis not present

## 2023-10-22 DIAGNOSIS — Z4659 Encounter for fitting and adjustment of other gastrointestinal appliance and device: Secondary | ICD-10-CM | POA: Diagnosis not present

## 2023-10-22 DIAGNOSIS — Z955 Presence of coronary angioplasty implant and graft: Secondary | ICD-10-CM | POA: Diagnosis not present

## 2023-10-22 DIAGNOSIS — F0390 Unspecified dementia without behavioral disturbance: Secondary | ICD-10-CM | POA: Diagnosis not present

## 2023-10-22 DIAGNOSIS — R140 Abdominal distension (gaseous): Secondary | ICD-10-CM | POA: Diagnosis not present

## 2023-10-22 DIAGNOSIS — K6389 Other specified diseases of intestine: Secondary | ICD-10-CM | POA: Diagnosis not present

## 2023-10-22 DIAGNOSIS — I10 Essential (primary) hypertension: Secondary | ICD-10-CM | POA: Diagnosis not present

## 2023-10-22 DIAGNOSIS — I2510 Atherosclerotic heart disease of native coronary artery without angina pectoris: Secondary | ICD-10-CM | POA: Diagnosis not present

## 2023-10-22 DIAGNOSIS — K56600 Partial intestinal obstruction, unspecified as to cause: Secondary | ICD-10-CM | POA: Diagnosis not present

## 2023-10-23 DIAGNOSIS — K4030 Unilateral inguinal hernia, with obstruction, without gangrene, not specified as recurrent: Secondary | ICD-10-CM | POA: Diagnosis not present

## 2023-10-23 DIAGNOSIS — K56699 Other intestinal obstruction unspecified as to partial versus complete obstruction: Secondary | ICD-10-CM | POA: Diagnosis not present

## 2023-10-25 HISTORY — PX: OTHER SURGICAL HISTORY: SHX169

## 2023-10-26 ENCOUNTER — Telehealth: Payer: Self-pay | Admitting: Family

## 2023-10-26 NOTE — Telephone Encounter (Signed)
 Copied from CRM [id number]. Topic: Referral - Question >> Oct 26, 2023  8:42 AM Yolande Hench C wrote: Reason for CRM: Carolyn(Patients DPR) has requested for patient PCP to do a referral for a general surgeon; Orelia Binet has requested a follow up call #[phone]

## 2023-10-31 ENCOUNTER — Ambulatory Visit

## 2023-11-02 ENCOUNTER — Ambulatory Visit (INDEPENDENT_AMBULATORY_CARE_PROVIDER_SITE_OTHER): Admitting: Family

## 2023-11-02 ENCOUNTER — Encounter: Payer: Self-pay | Admitting: Family

## 2023-11-02 ENCOUNTER — Other Ambulatory Visit: Payer: Self-pay

## 2023-11-02 ENCOUNTER — Other Ambulatory Visit (HOSPITAL_BASED_OUTPATIENT_CLINIC_OR_DEPARTMENT_OTHER): Payer: Self-pay

## 2023-11-02 VITALS — BP 103/60 | HR 62 | Ht 74.0 in | Wt 170.6 lb

## 2023-11-02 DIAGNOSIS — L089 Local infection of the skin and subcutaneous tissue, unspecified: Secondary | ICD-10-CM

## 2023-11-02 DIAGNOSIS — N400 Enlarged prostate without lower urinary tract symptoms: Secondary | ICD-10-CM | POA: Diagnosis not present

## 2023-11-02 DIAGNOSIS — E538 Deficiency of other specified B group vitamins: Secondary | ICD-10-CM

## 2023-11-02 DIAGNOSIS — J309 Allergic rhinitis, unspecified: Secondary | ICD-10-CM

## 2023-11-02 MED ORDER — MIRTAZAPINE 15 MG PO TABS
15.0000 mg | ORAL_TABLET | Freq: Every day | ORAL | 1 refills | Status: DC
  Filled 2023-11-02 – 2024-01-07 (×3): qty 90, 90d supply, fill #0
  Filled 2024-04-01: qty 90, 90d supply, fill #1

## 2023-11-02 MED ORDER — DONEPEZIL HCL 10 MG PO TABS
10.0000 mg | ORAL_TABLET | Freq: Every day | ORAL | 1 refills | Status: DC
  Filled 2023-11-02: qty 90, 90d supply, fill #0
  Filled 2024-01-21: qty 90, 90d supply, fill #1

## 2023-11-02 MED ORDER — CYANOCOBALAMIN 1000 MCG/ML IJ SOLN
1000.0000 ug | Freq: Once | INTRAMUSCULAR | Status: AC
  Administered 2023-11-02: 1000 ug via INTRAMUSCULAR

## 2023-11-02 MED ORDER — CEPHALEXIN 500 MG PO CAPS
500.0000 mg | ORAL_CAPSULE | Freq: Three times a day (TID) | ORAL | 0 refills | Status: DC
  Filled 2023-11-02: qty 21, 7d supply, fill #0

## 2023-11-02 MED ORDER — ROSUVASTATIN CALCIUM 5 MG PO TABS
5.0000 mg | ORAL_TABLET | Freq: Every day | ORAL | 1 refills | Status: DC
  Filled 2023-11-02 – 2024-01-07 (×3): qty 90, 90d supply, fill #0
  Filled 2024-04-01: qty 90, 90d supply, fill #1

## 2023-11-02 MED ORDER — TAMSULOSIN HCL 0.4 MG PO CAPS
0.4000 mg | ORAL_CAPSULE | Freq: Every day | ORAL | 1 refills | Status: DC
  Filled 2023-11-02 – 2024-01-07 (×3): qty 90, 90d supply, fill #0
  Filled 2024-04-01: qty 90, 90d supply, fill #1

## 2023-11-02 MED ORDER — MEMANTINE HCL 10 MG PO TABS
10.0000 mg | ORAL_TABLET | Freq: Two times a day (BID) | ORAL | 1 refills | Status: DC
  Filled 2023-11-02: qty 180, 90d supply, fill #0
  Filled 2024-01-21: qty 180, 90d supply, fill #1

## 2023-11-02 MED ORDER — AMLODIPINE BESYLATE 5 MG PO TABS
5.0000 mg | ORAL_TABLET | Freq: Every day | ORAL | 1 refills | Status: DC
  Filled 2023-11-02 – 2024-01-16 (×2): qty 90, 90d supply, fill #0
  Filled 2024-04-17: qty 90, 90d supply, fill #1

## 2023-11-02 NOTE — Progress Notes (Unsigned)
 Subjective:     Patient ID: Steven Doyle, male    DOB: 09/03/1933, 89 y.o.   MRN: [id number]  Chief Complaint  Patient presents with  . Suture / Staple Removal    Patient presents today for staples removal.    HPI  Discussed the use of AI scribe software for clinical note transcription with the patient, who gave verbal consent to proceed.  History of Present Illness       Health Maintenance Due  Topic Date Due  . Medicare Annual Wellness (AWV)  12/18/2023    Past Medical History:  Diagnosis Date  . Compression fracture of fifth lumbar vertebra (HCC)   . Coronary artery disease   . Dementia (HCC)   . Derangement of posterior horn of medial meniscus 12/03/2013  . History of diverticulitis   . History of herpes zoster 02/16/2014  . Hypercholesterolemia   . Memory loss   . Pulmonary embolus [facility])     Past Surgical History:  Procedure Laterality Date  . CARDIAC SURGERY     2008 bypass  . CORONARY ARTERY BYPASS GRAFT  2008   x 7  . HYDROCELE EXCISION / REPAIR    . IR KYPHO EA ADDL LEVEL THORACIC OR LUMBAR  08/06/2018  . IR KYPHO EA ADDL LEVEL THORACIC OR LUMBAR  08/06/2018  . IR KYPHO EA ADDL LEVEL THORACIC OR LUMBAR  10/01/2018  . IR KYPHO EA ADDL LEVEL THORACIC OR LUMBAR  10/01/2018  . IR KYPHO LUMBAR INC FX REDUCE BONE BX UNI/BIL CANNULATION INC/IMAGING  08/06/2018  . IR KYPHO LUMBAR INC FX REDUCE BONE BX UNI/BIL CANNULATION INC/IMAGING  10/01/2018  . IR RADIOLOGIST EVAL & MGMT  07/31/2018  . IR RADIOLOGIST EVAL & MGMT  08/28/2018  . PROSTATE SURGERY    . ROTATOR CUFF REPAIR      Family History  Problem Relation Age of Onset  . Arthritis/Rheumatoid Mother        died from study drug  . Breast cancer Sister 87  . Heart disease Brother   . Stomach cancer Other     Social History   Socioeconomic History  . Marital status: Married    Spouse name: Not on file  . Number of children: Not on file  . Years of education: Not on file  . Highest education  level: Not on file  Occupational History  . Not on file  Tobacco Use  . Smoking status: Former    Types: Cigarettes  . Smokeless tobacco: Never  . Tobacco comments:    Smoked briefly while in the service for 3 years  Substance and Sexual Activity  . Alcohol use: No  . Drug use: Never  . Sexual activity: Not Currently  Other Topics Concern  . Not on file  Social History Narrative   ** Merged History Encounter **       One daughter living estranged but lives locally Lives with male partner Worked with Software engineer Grew up in [place] Completed some high school He served in the [employer] Has 2 cats Did not really know his father    Social Drivers of Corporate investment banker Strain: Not on file  Food Insecurity: Not on file  Transportation Needs: Not on file  Physical Activity: Not on file  Stress: Not on file  Social Connections: Unknown (11/10/2021)   Received from [employer], [facility]   Social Network   . Social Network: Not on file  Intimate Partner Violence: Unknown (11/10/2021)  [facility]   HITS   . Physically Hurt: Not on file   . Insult or Talk Down To: Not on file   . Threaten Physical Harm: Not on file   . Scream or Curse: Not on file    Outpatient Medications Prior to Visit  Medication Sig Dispense Refill  . amLODipine  (NORVASC ) 5 MG tablet Take 1 tablet (5 mg total) by mouth daily. 90 tablet 1  . Ca Phosphate-Cholecalciferol (CALCIUM  WITH D3) 250-12.5 MG-MCG CHEW Chew by mouth.    . cetirizine  (ZYRTEC ) 10 MG tablet Take 1 tablet (10 mg total) by mouth daily.    . cyanocobalamin  (VITAMIN B12) 1000 MCG tablet Take 1 tablet (1,000 mcg total) by mouth daily.    . donepezil  (ARICEPT ) 10 MG tablet Take 1 tablet (10 mg total) by mouth at bedtime. 90 tablet 1  . FLOMAX 0.4 MG CAPS capsule Take 0.4 mg by mouth daily.    . fluticasone  (FLONASE ) 50 MCG/ACT nasal spray Place 2 sprays into both nostrils daily.     . memantine  (NAMENDA ) 10 MG tablet Take 1 tablet (10 mg total) by mouth 2 (two) times daily. 180 tablet 1  . mirtazapine  (REMERON ) 15 MG tablet Take 1 tablet (15 mg total) by mouth at bedtime. 90 tablet 1  . ondansetron  (ZOFRAN -ODT) 4 MG disintegrating tablet Take 4 mg by mouth every 8 (eight) hours as needed.    . rosuvastatin  (CRESTOR ) 5 MG tablet Take 1 tablet (5 mg total) by mouth daily at 6 PM. 90 tablet 1  . tiZANidine  (ZANAFLEX ) 4 MG tablet Take 1 tablet (4 mg total) by mouth at bedtime. 90 tablet 1  . tiZANidine  (ZANAFLEX ) 4 MG tablet Take 1 tablet (4 mg total) by mouth every evening. 30 tablet 2   Facility-Administered Medications Prior to Visit  Medication Dose Route Frequency Provider Last Rate Last Admin  . denosumab  (PROLIA ) injection 60 mg  60 mg Subcutaneous Once O'Sullivan, Maty Zeisler, NP        Allergies  Allergen Reactions  . Atorvastatin Other (See Comments)    Muscle aches  . Sulfa Antibiotics Swelling  . Terazosin Swelling    ROS     Objective:     Physical Exam   BP 103/60   Pulse 62   Ht 6\' 2"  (1.88 m)   Wt 170 lb 9.6 oz (77.4 kg)   SpO2 97%   BMI 21.90 kg/m  Wt Readings from Last 3 Encounters:  11/02/23 170 lb 9.6 oz (77.4 kg)  08/07/23 172 lb (78 kg)  07/10/23 176 lb (79.8 kg)       Assessment & Plan:   Problem List Items Addressed This Visit   None   I am having Rhonda Certain maintain his Calcium  with D3, cyanocobalamin , tiZANidine , rosuvastatin , donepezil , memantine , tiZANidine , fluticasone , cetirizine , amLODipine , mirtazapine , Flomax, and ondansetron . We will continue to administer denosumab .  No orders of the defined types were placed in this encounter.

## 2023-11-02 NOTE — Patient Instructions (Addendum)
 VISIT SUMMARY:  Today, you came in for staple removal following your hernia surgery. You are recovering well with no complications. We also discussed your dementia care and living arrangements.  YOUR PLAN:  POSTOPERATIVE CARE FOR INGUINAL HERNIA REPAIR: You are recovering well after your hernia surgery with no complications. -We removed your surgical staples after confirming that your healing is adequate.  DEMENTIA: You have moderate dementia with memory loss  -Please call [facility] to schedule a follow up visit with Dr. Gracie Lav Address: [address], [place], [place] [address] Phone: [phone]   -We discussed your living arrangements and safety with your family to ensure you have the support you need.

## 2023-11-05 ENCOUNTER — Other Ambulatory Visit: Payer: Self-pay

## 2023-11-05 ENCOUNTER — Encounter: Payer: Self-pay | Admitting: Family

## 2023-11-05 DIAGNOSIS — L089 Local infection of the skin and subcutaneous tissue, unspecified: Secondary | ICD-10-CM | POA: Insufficient documentation

## 2023-11-05 HISTORY — DX: Local infection of the skin and subcutaneous tissue, unspecified: L08.9

## 2023-11-05 NOTE — Assessment & Plan Note (Addendum)
 Mild skin infection noted along incision.  Sutures removed and patient tolerated procedure well.  Rx with keflex to cover mild associated skin infection.  Family advised to keep a close eye on the area and let me know if the redness worsens or if it does not improve.  Steri strips applied.

## 2023-11-05 NOTE — Assessment & Plan Note (Signed)
 B12 injection today

## 2023-11-05 NOTE — Assessment & Plan Note (Signed)
 Lab Results  Component Value Date   PSA 3.26 08/20/2023    Urinating well with addition of flomax which was prescribed by hospital.  Continue same.

## 2023-11-06 ENCOUNTER — Ambulatory Visit: Payer: Medicare PPO | Admitting: Family

## 2023-12-05 ENCOUNTER — Other Ambulatory Visit: Payer: Self-pay | Admitting: Family

## 2023-12-05 NOTE — Telephone Encounter (Signed)
 Please advise patient contact that I would like to try him off of the tizanidine  at this point given his age and the fact that it can increase his risk of falls.

## 2023-12-06 ENCOUNTER — Other Ambulatory Visit (HOSPITAL_BASED_OUTPATIENT_CLINIC_OR_DEPARTMENT_OTHER): Payer: Self-pay

## 2023-12-07 ENCOUNTER — Other Ambulatory Visit (HOSPITAL_BASED_OUTPATIENT_CLINIC_OR_DEPARTMENT_OTHER): Payer: Self-pay

## 2023-12-10 NOTE — Telephone Encounter (Signed)
 Information given to patient's daughter, Blaise Bumps.

## 2023-12-19 ENCOUNTER — Ambulatory Visit: Admitting: Family

## 2023-12-19 VITALS — BP 119/62 | HR 55 | Temp 98.1°F | Resp 16 | Ht 74.0 in | Wt 163.0 lb

## 2023-12-19 DIAGNOSIS — E538 Deficiency of other specified B group vitamins: Secondary | ICD-10-CM

## 2023-12-19 DIAGNOSIS — N400 Enlarged prostate without lower urinary tract symptoms: Secondary | ICD-10-CM

## 2023-12-19 DIAGNOSIS — F411 Generalized anxiety disorder: Secondary | ICD-10-CM | POA: Diagnosis not present

## 2023-12-19 DIAGNOSIS — E785 Hyperlipidemia, unspecified: Secondary | ICD-10-CM

## 2023-12-19 DIAGNOSIS — E559 Vitamin D deficiency, unspecified: Secondary | ICD-10-CM | POA: Diagnosis not present

## 2023-12-19 DIAGNOSIS — I10 Essential (primary) hypertension: Secondary | ICD-10-CM | POA: Diagnosis not present

## 2023-12-19 DIAGNOSIS — R634 Abnormal weight loss: Secondary | ICD-10-CM | POA: Diagnosis not present

## 2023-12-19 DIAGNOSIS — R630 Anorexia: Secondary | ICD-10-CM

## 2023-12-19 DIAGNOSIS — I25810 Atherosclerosis of coronary artery bypass graft(s) without angina pectoris: Secondary | ICD-10-CM | POA: Diagnosis not present

## 2023-12-19 MED ORDER — CYANOCOBALAMIN 1000 MCG/ML IJ SOLN
1000.0000 ug | Freq: Once | INTRAMUSCULAR | Status: AC
  Administered 2023-12-19: 1000 ug via INTRAMUSCULAR

## 2023-12-19 NOTE — Assessment & Plan Note (Signed)
 Stable without medication

## 2023-12-19 NOTE — Assessment & Plan Note (Addendum)
 Stable on flomax .  Lab Results  Component Value Date   PSA 3.26 08/20/2023

## 2023-12-19 NOTE — Progress Notes (Signed)
 Subjective:     Patient ID: Steven Doyle, male    DOB: 06-04-1934, 89 y.o.   MRN: [id number]  Chief Complaint  Patient presents with   Hypertension    Here for follow up    HPI  Discussed the use of AI scribe software for clinical note transcription with the patient, who gave verbal consent to proceed.  History of Present Illness Pradyun Ishman is an 89 year old male who presents for follow-up and medication review. He is accompanied today by his partner [place]. He experiences decreased appetite and weight loss, which he attributes to aging. He has prostate enlargement and takes Flomax  for urine retention, with no current urination issues. He has coronary artery disease and previously underwent a bypass surgery.   SABRA He takes Crestor  for cholesterol, with recent tests showing good levels. He receives monthly B12 shots to maintain adequate levels. He takes Aricept  and Namenda , with prescriptions due for a refill in August. He experiences mood fluctuations with decreased frequency of outbursts.         Health Maintenance Due  Topic Date Due   Medicare Annual Wellness (AWV)  12/18/2023    Past Medical History:  Diagnosis Date   CAD (coronary artery disease)    Compression fracture of fifth lumbar vertebra (HCC)    Coronary artery disease    Dementia (HCC)    Derangement of posterior horn of medial meniscus 12/03/2013   History of diverticulitis    History of herpes zoster 02/16/2014   Hypercholesterolemia    Memory loss    Pulmonary embolus (HCC)     Past Surgical History:  Procedure Laterality Date   CARDIAC SURGERY     2008 bypass   CORONARY ARTERY BYPASS GRAFT  2008   x 7   HYDROCELE EXCISION / REPAIR     IR KYPHO EA ADDL LEVEL THORACIC OR LUMBAR  08/06/2018   IR KYPHO EA ADDL LEVEL THORACIC OR LUMBAR  08/06/2018   IR KYPHO EA ADDL LEVEL THORACIC OR LUMBAR  10/01/2018   IR KYPHO EA ADDL LEVEL THORACIC OR LUMBAR  10/01/2018   IR KYPHO LUMBAR INC FX REDUCE BONE BX  UNI/BIL CANNULATION INC/IMAGING  08/06/2018   IR KYPHO LUMBAR INC FX REDUCE BONE BX UNI/BIL CANNULATION INC/IMAGING  10/01/2018   IR RADIOLOGIST EVAL & MGMT  07/31/2018   IR RADIOLOGIST EVAL & MGMT  08/28/2018   left inguinal hernia repair Left 10/2023   PROSTATE SURGERY     ROTATOR CUFF REPAIR      Family History  Problem Relation Age of Onset   Arthritis/Rheumatoid Mother        died from study drug   Breast cancer Sister 72   Heart disease Brother    Stomach cancer Other     Social History   Socioeconomic History   Marital status: Married    Spouse name: Not on file   Number of children: Not on file   Years of education: Not on file   Highest education level: Not on file  Occupational History   Not on file  Tobacco Use   Smoking status: Former    Types: Cigarettes   Smokeless tobacco: Never   Tobacco comments:    Smoked briefly while in the service for 3 years  Substance and Sexual Activity   Alcohol use: No   Drug use: Never   Sexual activity: Not Currently  Other Topics Concern   Not on file  Social History Narrative   **  Merged History Encounter **       One daughter living estranged but lives locally Lives with male partner Worked with Software engineer Grew up in [place] Completed some high school He served in the [employer] Has 2 cats Did not really know his father    Social Drivers of Corporate investment banker Strain: Not on file  Food Insecurity: Not on file  Transportation Needs: Not on file  Physical Activity: Not on file  Stress: Not on file  Social Connections: Unknown (11/10/2021)   Received from [facility]   Social Network    Social Network: Not on file  Intimate Partner Violence: Unknown (11/10/2021)   Received from [facility]   HITS    Physically Hurt: Not on file    Insult or Talk Down To: Not on file    Threaten Physical Harm: Not on file    Scream or Curse: Not on file    Outpatient Medications Prior to  Visit  Medication Sig Dispense Refill   amLODipine  (NORVASC ) 5 MG tablet Take 1 tablet (5 mg total) by mouth daily. 90 tablet 1   Ca Phosphate-Cholecalciferol (CALCIUM  WITH D3) 250-12.5 MG-MCG CHEW Chew by mouth.     cetirizine  (ZYRTEC ) 10 MG tablet Take 1 tablet (10 mg total) by mouth daily.     cyanocobalamin  (VITAMIN B12) 1000 MCG tablet Take 1 tablet (1,000 mcg total) by mouth daily.     donepezil  (ARICEPT ) 10 MG tablet Take 1 tablet (10 mg total) by mouth at bedtime. 90 tablet 1   memantine  (NAMENDA ) 10 MG tablet Take 1 tablet (10 mg total) by mouth 2 (two) times daily. 180 tablet 1   mirtazapine  (REMERON ) 15 MG tablet Take 1 tablet (15 mg total) by mouth at bedtime. 90 tablet 1   rosuvastatin  (CRESTOR ) 5 MG tablet Take 1 tablet (5 mg total) by mouth daily at 6 PM. 90 tablet 1   tamsulosin  (FLOMAX ) 0.4 MG CAPS capsule Take 1 capsule (0.4 mg total) by mouth daily. 90 capsule 1   fluticasone  (FLONASE ) 50 MCG/ACT nasal spray Place 2 sprays into both nostrils daily. (Patient not taking: Reported on 12/19/2023)     cephALEXin  (KEFLEX ) 500 MG capsule Take 1 capsule (500 mg total) by mouth 3 (three) times daily. 21 capsule 0   ondansetron  (ZOFRAN -ODT) 4 MG disintegrating tablet Take 4 mg by mouth every 8 (eight) hours as needed.     tiZANidine  (ZANAFLEX ) 4 MG tablet Take 1 tablet (4 mg total) by mouth at bedtime. (Patient not taking: Reported on 12/19/2023) 90 tablet 1   Facility-Administered Medications Prior to Visit  Medication Dose Route Frequency Provider Last Rate Last Admin   denosumab  (PROLIA ) injection 60 mg  60 mg Subcutaneous Once O'Sullivan, Katrece Roediger, NP        Allergies  Allergen Reactions   Atorvastatin Other (See Comments)    Muscle aches   Sulfa Antibiotics Swelling   Terazosin Swelling    ROS    See HPI Objective:    Physical Exam   BP 119/62 (BP Location: Right Arm, Patient Position: Sitting, Cuff Size: Normal)   Pulse (!) 55   Temp 98.1 F (36.7 C) (Oral)   Resp  16   Ht 6' 2 (1.88 m)   Wt 163 lb (73.9 kg)   SpO2 95%   BMI 20.93 kg/m  Wt Readings from Last 3 Encounters:  12/19/23 163 lb (73.9 kg)  11/02/23 170 lb 9.6 oz (77.4 kg)  08/07/23 172  lb (78 kg)       Assessment & Plan:   Problem List Items Addressed This Visit       Unprioritized   Vitamin D  deficiency - Primary   Normal in January.      Unintended weight loss   Wt Readings from Last 3 Encounters:  12/19/23 163 lb (73.9 kg)  11/02/23 170 lb 9.6 oz (77.4 kg)  08/07/23 172 lb (78 kg)         GAD (generalized anxiety disorder)   Stable without medication.      Essential hypertension   BP Readings from Last 3 Encounters:  12/19/23 119/62  11/02/23 103/60  08/07/23 120/64   BP stable on amlodipine .      Relevant Medications   aspirin EC 81 MG tablet   Other Relevant Orders   Basic Metabolic Panel (BMET) (Completed)   Dyslipidemia   Lab Results  Component Value Date   CHOL 134 03/28/2023   HDL 44.90 03/28/2023   LDLCALC 69 03/28/2023   TRIG 101.0 03/28/2023   CHOLHDL 3 03/28/2023  Lipids stable on crestor , continue same.        Decreased appetite    Decreased appetite common with aging. Previous tests normal. Advised dietary adjustments.  - Encourage small snacks between meals, avoiding sweets.  - CT chest/abd/pelvis did not show any findings concerning for malignancy. Wt Readings from Last 3 Encounters:  12/19/23 163 lb (73.9 kg)  11/02/23 170 lb 9.6 oz (77.4 kg)  08/07/23 172 lb (78 kg)          Coronary artery disease involving coronary bypass graft of native heart without angina pectoris   Clinically stable but not following with cardiology. Will refer for ongoing management.  Continue aspirin, crestor .       Relevant Medications   aspirin EC 81 MG tablet   Other Relevant Orders   Ambulatory referral to Cardiology   Benign prostatic hyperplasia   Stable on flomax .  Lab Results  Component Value Date   PSA 3.26 08/20/2023           B12 deficiency   Continue b12 injections.        I have discontinued Mikey Pilger's tiZANidine , ondansetron , and cephALEXin . I am also having him start on aspirin EC. Additionally, I am having him maintain his Calcium  with D3, cyanocobalamin , fluticasone , cetirizine , amLODipine , donepezil , memantine , mirtazapine , rosuvastatin , and tamsulosin . We administered cyanocobalamin . We will continue to administer denosumab .  Meds ordered this encounter  Medications   cyanocobalamin  (VITAMIN B12) injection 1,000 mcg   aspirin EC 81 MG tablet    Sig: Take 1 tablet (81 mg total) by mouth daily. Swallow whole.    Supervising Provider:   DOMENICA BLACKBIRD [phone]

## 2023-12-19 NOTE — Assessment & Plan Note (Signed)
Normal in January.

## 2023-12-19 NOTE — Assessment & Plan Note (Signed)
 BP Readings from Last 3 Encounters:  12/19/23 119/62  11/02/23 103/60  08/07/23 120/64   BP stable on amlodipine .

## 2023-12-19 NOTE — Assessment & Plan Note (Signed)
 Lab Results  Component Value Date   CHOL 134 03/28/2023   HDL 44.90 03/28/2023   LDLCALC 69 03/28/2023   TRIG 101.0 03/28/2023   CHOLHDL 3 03/28/2023  Lipids stable on crestor , continue same.

## 2023-12-19 NOTE — Assessment & Plan Note (Signed)
 Wt Readings from Last 3 Encounters:  12/19/23 163 lb (73.9 kg)  11/02/23 170 lb 9.6 oz (77.4 kg)  08/07/23 172 lb (78 kg)

## 2023-12-20 LAB — BASIC METABOLIC PANEL WITH GFR
BUN: 27 mg/dL — ABNORMAL HIGH (ref 6–23)
CO2: 30 meq/L (ref 19–32)
Calcium: 9.5 mg/dL (ref 8.4–10.5)
Chloride: 106 meq/L (ref 96–112)
Creatinine, Ser: 1.27 mg/dL (ref 0.40–1.50)
GFR: 50.48 mL/min — ABNORMAL LOW (ref >60.00)
Glucose, Bld: 96 mg/dL (ref 70–99)
Potassium: 4.7 meq/L (ref 3.5–5.1)
Sodium: 144 meq/L (ref 135–145)

## 2023-12-21 ENCOUNTER — Encounter: Payer: Self-pay | Admitting: Family

## 2023-12-21 ENCOUNTER — Ambulatory Visit: Payer: Self-pay | Admitting: Family

## 2023-12-21 DIAGNOSIS — R630 Anorexia: Secondary | ICD-10-CM | POA: Insufficient documentation

## 2023-12-21 DIAGNOSIS — I25810 Atherosclerosis of coronary artery bypass graft(s) without angina pectoris: Secondary | ICD-10-CM | POA: Insufficient documentation

## 2023-12-21 HISTORY — DX: Atherosclerosis of coronary artery bypass graft(s) without angina pectoris: I25.810

## 2023-12-21 HISTORY — DX: Anorexia: R63.0

## 2023-12-21 MED ORDER — ASPIRIN 81 MG PO TBEC: 81.0000 mg | DELAYED_RELEASE_TABLET | Freq: Every day | ORAL | Status: AC

## 2023-12-21 NOTE — Assessment & Plan Note (Signed)
 Clinically stable but not following with cardiology. Will refer for ongoing management.  Continue aspirin, crestor .

## 2023-12-21 NOTE — Telephone Encounter (Signed)
 Please confirm that he is taking aspirin 81mg  once daily.   Lab work is stable. No changes.

## 2023-12-21 NOTE — Patient Instructions (Signed)
 VISIT SUMMARY:  Today, we reviewed your medications and discussed several health concerns, including your heart condition, prostate health, appetite, mood, cognitive function, bone health, and vitamin B12 levels.  YOUR PLAN:  CORONARY ARTERY DISEASE: You have a history of coronary artery disease and underwent a seven-bypass surgery. -We will refer you to a cardiologist for ongoing management.  ATRIAL FIBRILLATION MARKERS: You have markers for atrial fibrillation but no formal diagnosis yet. -We will refer you to a cardiologist for evaluation.  PROSTATE ENLARGEMENT: Your prostate enlargement is being managed with Flomax , which is effectively preventing urinary retention. -Continue taking Flomax  as prescribed.  DECREASED APPETITE: You have a decreased appetite, which is common with aging. -Try to have small snacks between meals and avoid sweets.  MOOD SWINGS: You have experienced mood swings, but the frequency of episodes has decreased. -Continue monitoring your mood and let us  know if there are any changes.  COGNITIVE IMPAIRMENT: You are taking Aricept  and Namenda  for cognitive impairment. -Continue taking Aricept  and Namenda  as prescribed.  OSTEOPOROSIS: You have a history of fracture and titanium rod placement, and no recent bone density test. -We will order a bone density test. -We will discuss scheduling Reclast treatment.  KIDNEY LESION: You have a benign lesion on your left kidney. -We will monitor your kidney function with blood tests.  VITAMIN B12 DEFICIENCY: You require ongoing B12 injections to maintain adequate levels. -We administered your B12 injection today. -Continue with monthly B12 injections.  GENERAL HEALTH MAINTENANCE: You are on Crestor  with good cholesterol levels and require routine screenings. -We will check your A1c and kidney function. -We will order a vitamin D  test.  FOLLOW-UP: You need follow-up for your chronic conditions and a dermatology  referral. -We will schedule a follow-up appointment in three months. -We will refer you to a dermatologist for a skin evaluation.

## 2023-12-21 NOTE — Progress Notes (Signed)
Called patient but no answer and no voice mail 

## 2023-12-21 NOTE — Assessment & Plan Note (Signed)
Continue b12 injections.  

## 2023-12-21 NOTE — Assessment & Plan Note (Signed)
  Decreased appetite common with aging. Previous tests normal. Advised dietary adjustments.  - Encourage small snacks between meals, avoiding sweets.  - CT chest/abd/pelvis did not show any findings concerning for malignancy. Wt Readings from Last 3 Encounters:  12/19/23 163 lb (73.9 kg)  11/02/23 170 lb 9.6 oz (77.4 kg)  08/07/23 172 lb (78 kg)

## 2023-12-25 NOTE — Telephone Encounter (Signed)
 Information given to patient's spouse.  She confirmed he is not taking aspirin

## 2023-12-26 ENCOUNTER — Telehealth: Payer: Self-pay

## 2023-12-26 ENCOUNTER — Other Ambulatory Visit (HOSPITAL_BASED_OUTPATIENT_CLINIC_OR_DEPARTMENT_OTHER): Payer: Self-pay

## 2023-12-26 NOTE — Telephone Encounter (Signed)
 Per patient' spouse he will like to referred for reclast infusions.

## 2023-12-26 NOTE — Telephone Encounter (Signed)
 Please initiate Reclast infusion. His spouse is also interested in Reclast Marletta Sow).  Hopefully we can schedule them for the same day? tks

## 2023-12-27 ENCOUNTER — Encounter: Payer: Self-pay | Admitting: Family

## 2023-12-27 NOTE — Telephone Encounter (Signed)
 Reclast order entered.  Infusion center to contact patient.  Please attempt to schedule with spouse.

## 2023-12-31 ENCOUNTER — Telehealth: Payer: Self-pay

## 2023-12-31 NOTE — Telephone Encounter (Signed)
 Steven Doyle, patient will be scheduled as soon as possible.  Auth Submission: NO AUTH NEEDED Site of care: Site of care: [facility] Payer: [employer] medicare Medication & CPT/J Code(s) submitted: Reclast (Zolendronic acid) [id number] Diagnosis Code:  Route of submission (phone, fax, portal): portal Phone # Fax # Auth type: Buy/Bill PB Units/visits requested: 5mg  x 1 dose Reference number:  Approval from: 12/31/23 to 06/25/24

## 2024-01-03 ENCOUNTER — Telehealth: Payer: Self-pay | Admitting: Family

## 2024-01-03 NOTE — Telephone Encounter (Signed)
 Just give me the okay and I will overbook!

## 2024-01-03 NOTE — Telephone Encounter (Signed)
 Copied from CRM [id number]. Topic: Appointments - Scheduling Inquiry for Clinic >> Jan 03, 2024  2:45 PM Geroldine GRADE wrote: Reason for CRM: Elveria is calling to see if the patient can come in on Monday the 14th for his b12 at 1 or 115. She will be in the building and it would work out if possible

## 2024-01-07 ENCOUNTER — Ambulatory Visit

## 2024-01-07 ENCOUNTER — Other Ambulatory Visit (HOSPITAL_BASED_OUTPATIENT_CLINIC_OR_DEPARTMENT_OTHER): Payer: Self-pay

## 2024-01-07 ENCOUNTER — Ambulatory Visit (INDEPENDENT_AMBULATORY_CARE_PROVIDER_SITE_OTHER)

## 2024-01-07 DIAGNOSIS — E538 Deficiency of other specified B group vitamins: Secondary | ICD-10-CM | POA: Diagnosis not present

## 2024-01-07 MED ORDER — CYANOCOBALAMIN 1000 MCG/ML IJ SOLN
1000.0000 ug | Freq: Once | INTRAMUSCULAR | Status: AC
  Administered 2024-01-07: 1000 ug via INTRAMUSCULAR

## 2024-01-07 NOTE — Progress Notes (Signed)
 Pt here for monthly B12 injection per original order dated: 12/19/23 on last ov note continue B12 injections  Last B12 injection:11/02/23 during ov  Last B12 level:  07/10/23 was 293  B12 given IM, and pt tolerated injection well.  Next B12 injection scheduled for:  02/07/24

## 2024-01-07 NOTE — Addendum Note (Signed)
 Addended by: WELLS LEVORN HERO on: 01/07/2024 04:18 PM   Modules accepted: Orders

## 2024-01-08 ENCOUNTER — Ambulatory Visit

## 2024-01-15 ENCOUNTER — Ambulatory Visit

## 2024-01-16 ENCOUNTER — Other Ambulatory Visit (HOSPITAL_BASED_OUTPATIENT_CLINIC_OR_DEPARTMENT_OTHER): Payer: Self-pay

## 2024-01-21 ENCOUNTER — Other Ambulatory Visit: Payer: Self-pay

## 2024-01-21 ENCOUNTER — Other Ambulatory Visit (HOSPITAL_BASED_OUTPATIENT_CLINIC_OR_DEPARTMENT_OTHER): Payer: Self-pay

## 2024-01-31 ENCOUNTER — Ambulatory Visit (INDEPENDENT_AMBULATORY_CARE_PROVIDER_SITE_OTHER)

## 2024-01-31 VITALS — BP 124/63 | HR 45 | Temp 97.6°F | Resp 20 | Ht 74.0 in | Wt 167.2 lb

## 2024-01-31 DIAGNOSIS — M810 Age-related osteoporosis without current pathological fracture: Secondary | ICD-10-CM

## 2024-01-31 MED ORDER — DIPHENHYDRAMINE HCL 25 MG PO CAPS
25.0000 mg | ORAL_CAPSULE | Freq: Once | ORAL | Status: AC
  Administered 2024-01-31: 25 mg via ORAL
  Filled 2024-01-31: qty 1

## 2024-01-31 MED ORDER — SODIUM CHLORIDE 0.9 % IV SOLN: INTRAVENOUS | Status: DC

## 2024-01-31 MED ORDER — ACETAMINOPHEN 325 MG PO TABS
650.0000 mg | ORAL_TABLET | Freq: Once | ORAL | Status: AC
  Administered 2024-01-31: 650 mg via ORAL
  Filled 2024-01-31: qty 2

## 2024-01-31 MED ORDER — ZOLEDRONIC ACID 5 MG/100ML IV SOLN
5.0000 mg | Freq: Once | INTRAVENOUS | Status: AC
  Administered 2024-01-31: 5 mg via INTRAVENOUS
  Filled 2024-01-31: qty 100

## 2024-01-31 NOTE — Progress Notes (Signed)
 Diagnosis: Osteoporosis  Provider:  Lonna Coder MD  Procedure: IV Infusion  IV Type: Peripheral, IV Location: L Antecubital  Reclast  (Zolendronic Acid), Dose: 5 mg  Infusion Start Time: 1500  Infusion Stop Time: 1532  Post Infusion IV Care: Observation period completed and Peripheral IV Discontinued  Discharge: Condition: Good, Destination: Home . AVS Provided  Performed by:  Maximiano JONELLE Pouch, LPN

## 2024-02-07 ENCOUNTER — Encounter: Payer: Self-pay | Admitting: Family

## 2024-02-07 ENCOUNTER — Other Ambulatory Visit (HOSPITAL_BASED_OUTPATIENT_CLINIC_OR_DEPARTMENT_OTHER): Payer: Self-pay

## 2024-02-07 ENCOUNTER — Ambulatory Visit

## 2024-02-07 DIAGNOSIS — Z1330 Encounter for screening examination for mental health and behavioral disorders, unspecified: Secondary | ICD-10-CM | POA: Diagnosis not present

## 2024-02-07 DIAGNOSIS — I2510 Atherosclerotic heart disease of native coronary artery without angina pectoris: Secondary | ICD-10-CM | POA: Diagnosis not present

## 2024-02-07 DIAGNOSIS — I453 Trifascicular block: Secondary | ICD-10-CM | POA: Diagnosis not present

## 2024-02-07 DIAGNOSIS — E7800 Pure hypercholesterolemia, unspecified: Secondary | ICD-10-CM | POA: Diagnosis not present

## 2024-02-07 DIAGNOSIS — I10 Essential (primary) hypertension: Secondary | ICD-10-CM | POA: Diagnosis not present

## 2024-02-07 MED ORDER — ASPIRIN 81 MG PO TBEC
81.0000 mg | DELAYED_RELEASE_TABLET | Freq: Every day | ORAL | 3 refills | Status: AC
  Filled 2024-02-07: qty 120, 120d supply, fill #0
  Filled 2024-04-01: qty 120, 120d supply, fill #1
  Filled 2024-07-08: qty 100, 100d supply, fill #1

## 2024-02-11 ENCOUNTER — Other Ambulatory Visit (HOSPITAL_BASED_OUTPATIENT_CLINIC_OR_DEPARTMENT_OTHER): Payer: Self-pay

## 2024-02-13 ENCOUNTER — Other Ambulatory Visit (HOSPITAL_BASED_OUTPATIENT_CLINIC_OR_DEPARTMENT_OTHER): Payer: Self-pay

## 2024-02-14 ENCOUNTER — Ambulatory Visit (INDEPENDENT_AMBULATORY_CARE_PROVIDER_SITE_OTHER)

## 2024-02-14 ENCOUNTER — Other Ambulatory Visit (HOSPITAL_BASED_OUTPATIENT_CLINIC_OR_DEPARTMENT_OTHER): Payer: Self-pay

## 2024-02-14 DIAGNOSIS — E538 Deficiency of other specified B group vitamins: Secondary | ICD-10-CM

## 2024-02-14 MED ORDER — CYANOCOBALAMIN 1000 MCG/ML IJ SOLN
1000.0000 ug | Freq: Once | INTRAMUSCULAR | Status: AC
  Administered 2024-02-14: 1000 ug via INTRAMUSCULAR

## 2024-02-14 NOTE — Progress Notes (Signed)
 Pt here for monthly B12 injection per original order dated: 12/19/23 on last ov note continue B12 injections   Last B12 injection:01/07/2024   Last B12 level:  07/10/23 was 293   B12 1000mcg given IM, and pt tolerated injection well.   Next B12 injection scheduled for:  next monthly

## 2024-02-19 DIAGNOSIS — I453 Trifascicular block: Secondary | ICD-10-CM | POA: Diagnosis not present

## 2024-02-19 DIAGNOSIS — I2510 Atherosclerotic heart disease of native coronary artery without angina pectoris: Secondary | ICD-10-CM | POA: Diagnosis not present

## 2024-02-22 DIAGNOSIS — Z8719 Personal history of other diseases of the digestive system: Secondary | ICD-10-CM | POA: Insufficient documentation

## 2024-02-22 DIAGNOSIS — E785 Hyperlipidemia, unspecified: Secondary | ICD-10-CM | POA: Insufficient documentation

## 2024-02-22 DIAGNOSIS — R413 Other amnesia: Secondary | ICD-10-CM | POA: Insufficient documentation

## 2024-02-22 DIAGNOSIS — E7800 Pure hypercholesterolemia, unspecified: Secondary | ICD-10-CM | POA: Insufficient documentation

## 2024-02-22 DIAGNOSIS — S32050A Wedge compression fracture of fifth lumbar vertebra, initial encounter for closed fracture: Secondary | ICD-10-CM | POA: Insufficient documentation

## 2024-02-22 DIAGNOSIS — I2510 Atherosclerotic heart disease of native coronary artery without angina pectoris: Secondary | ICD-10-CM | POA: Insufficient documentation

## 2024-02-22 DIAGNOSIS — I2699 Other pulmonary embolism without acute cor pulmonale: Secondary | ICD-10-CM | POA: Insufficient documentation

## 2024-02-26 ENCOUNTER — Ambulatory Visit

## 2024-03-19 ENCOUNTER — Ambulatory Visit: Admitting: Family

## 2024-03-19 VITALS — BP 91/64 | HR 59 | Temp 98.2°F | Resp 16 | Ht 74.0 in | Wt 166.0 lb

## 2024-03-19 DIAGNOSIS — E7800 Pure hypercholesterolemia, unspecified: Secondary | ICD-10-CM | POA: Diagnosis not present

## 2024-03-19 DIAGNOSIS — M47816 Spondylosis without myelopathy or radiculopathy, lumbar region: Secondary | ICD-10-CM | POA: Diagnosis not present

## 2024-03-19 DIAGNOSIS — F411 Generalized anxiety disorder: Secondary | ICD-10-CM

## 2024-03-19 DIAGNOSIS — G4733 Obstructive sleep apnea (adult) (pediatric): Secondary | ICD-10-CM | POA: Diagnosis not present

## 2024-03-19 DIAGNOSIS — M810 Age-related osteoporosis without current pathological fracture: Secondary | ICD-10-CM | POA: Diagnosis not present

## 2024-03-19 DIAGNOSIS — I2510 Atherosclerotic heart disease of native coronary artery without angina pectoris: Secondary | ICD-10-CM

## 2024-03-19 DIAGNOSIS — K625 Hemorrhage of anus and rectum: Secondary | ICD-10-CM

## 2024-03-19 DIAGNOSIS — Z23 Encounter for immunization: Secondary | ICD-10-CM

## 2024-03-19 DIAGNOSIS — E538 Deficiency of other specified B group vitamins: Secondary | ICD-10-CM | POA: Diagnosis not present

## 2024-03-19 DIAGNOSIS — Z86711 Personal history of pulmonary embolism: Secondary | ICD-10-CM

## 2024-03-19 DIAGNOSIS — R413 Other amnesia: Secondary | ICD-10-CM | POA: Diagnosis not present

## 2024-03-19 DIAGNOSIS — N400 Enlarged prostate without lower urinary tract symptoms: Secondary | ICD-10-CM

## 2024-03-19 DIAGNOSIS — E785 Hyperlipidemia, unspecified: Secondary | ICD-10-CM

## 2024-03-19 MED ORDER — CALCIUM CARB-CHOLECALCIFEROL 600-20 MG-MCG PO TABS: 1.0000 | ORAL_TABLET | Freq: Two times a day (BID) | ORAL | Status: AC

## 2024-03-19 MED ORDER — CYANOCOBALAMIN 1000 MCG/ML IJ SOLN
1000.0000 ug | Freq: Once | INTRAMUSCULAR | Status: AC
  Administered 2024-03-19: 1000 ug via INTRAMUSCULAR

## 2024-03-19 NOTE — Patient Instructions (Signed)
 Stop vit D, start caltrate 600 + D twice daily and continue multivitamin.

## 2024-03-19 NOTE — Assessment & Plan Note (Signed)
 Lab Results  Component Value Date   CHOL 134 03/28/2023   HDL 44.90 03/28/2023   LDLCALC 69 03/28/2023   TRIG 101.0 03/28/2023   CHOLHDL 3 03/28/2023   Update lipid panel. Continue crestor .

## 2024-03-19 NOTE — Assessment & Plan Note (Signed)
 Maintained on aspirin  81mg .

## 2024-03-19 NOTE — Assessment & Plan Note (Signed)
 Stable on antihistamine.  Continue same.

## 2024-03-19 NOTE — Assessment & Plan Note (Addendum)
 Maintained on oral b12 1000 mcg PO daily along with monthly b12.

## 2024-03-19 NOTE — Assessment & Plan Note (Signed)
 Stable

## 2024-03-19 NOTE — Assessment & Plan Note (Signed)
 Provoked, treated with eliquis.

## 2024-03-19 NOTE — Assessment & Plan Note (Signed)
Resolved with improvement in constipation

## 2024-03-19 NOTE — Assessment & Plan Note (Signed)
 Spouse feels that pt's memory has improved on namenda /aricept .

## 2024-03-19 NOTE — Assessment & Plan Note (Signed)
 Recommended that he limit use of ibuprofen, ok to take tylenol  650mg  2-3 times daily

## 2024-03-19 NOTE — Assessment & Plan Note (Signed)
 Voiding well without flomax .

## 2024-03-19 NOTE — Assessment & Plan Note (Addendum)
 Uses cpap intermittently. Encouraged improved compliance.

## 2024-03-19 NOTE — Progress Notes (Unsigned)
 Subjective:     Patient ID: Steven Doyle, male    DOB: 1933-12-06, 89 y.o.   MRN: [id number]  No chief complaint on file.   HPI  Discussed the use of AI scribe software for clinical note transcription with the patient, who gave verbal consent to proceed.  History of Present Illness  ddie is an 89 year old male who presents today with his significant other, Steven Doyle. He has a history of obstructive sleep apnea but admits to only using CPAP intermittently. Regarding memory loss, he is maintained on Aricept  and Namenda , and his spouse feels that his memory has actually improved on this regimen.   He has chronic lower back pain for which he uses ibuprofen and Tylenol  as needed. He continues on Crestor  for hyperlipidemia, with the last lipid panel drawn in October 2024. In general, his anxiety and depression are well controlled. He has a history of coronary artery disease and is maintained on aspirin  81 mg for preventive purposes. Flomax  is on his medication list; however, he states he is not taking Flomax  and he is voiding without difficulty.   In terms of B12 deficiency, he is maintained on oral B12 1000 ?g daily. For osteoporosis, he is on Reclast  infusions. His spouse indicates he is not currently taking any calcium  supplementation but is taking a multivitamin and vitamin D  5000 IU daily.     Health Maintenance Due  Topic Date Due   Medicare Annual Wellness (AWV)  12/18/2023   Influenza Vaccine  01/25/2024   COVID-19 Vaccine (8 - 2024-25 season) 02/25/2024    Past Medical History:  Diagnosis Date   Age-related osteoporosis without current pathological fracture 01/17/2019   Allergic rhinitis 07/10/2023   Atherosclerotic heart disease of native coronary artery without angina pectoris 05/16/2017   Formatting of this note might be different from the original. Status post 7 vessel CABG 1/08     B12 deficiency 07/10/2023   Benign prostatic hyperplasia 11/07/2013   CAD (coronary artery  disease)    Calculus in bladder 11/07/2013   Cardiac dysrhythmia 06/15/2015   Chondromalacia patellae 12/03/2013   Chronic constipation 03/29/2023   Chronic ischemic heart disease 09/04/2022   Chronic midline low back pain without sciatica 01/25/2022   Compression fracture of fifth lumbar vertebra (HCC)    Coronary artery disease involving coronary bypass graft of native heart without angina pectoris 12/21/2023   Cortical senile cataract 10/27/2018   Current mild episode of major depressive disorder without prior episode 08/04/2018   Decreased appetite 12/21/2023   Dementia (HCC)    Dermatophytosis of nail 09/04/2022   Dyslipidemia 12/03/2013   >>OVERVIEW FOR HYPERLIPIDEMIA, UNSPECIFIED WRITTEN ON 09/04/2022  3:37 PM BY BELL, CANDACE N, CMA     Formatting of this note might be different from the original. On statin and red yeast rice     Essential hypertension 12/03/2013   Facet arthritis of lumbar region 01/25/2022   Family history of malignant neoplasm of digestive organs 05/04/2014   GAD (generalized anxiety disorder) 08/04/2018   Gastro-esophageal reflux disease without esophagitis 07/19/2015   History of diverticulitis    History of pulmonary embolus (PE) 07/04/2021   Hydrocele of testis 12/03/2013   Hypercholesterolemia    Insomnia 03/29/2023   Long term (current) use of aspirin  03/04/2018   Lumbar spondylosis 01/17/2019   Memory loss    Nasal polyps 12/03/2013   Obstructive sleep apnea (adult) (pediatric) 12/03/2013   Formatting of this note might be different from the original. With significant brady cardia on  c-pap     Pityriasis rosea 12/03/2013   Poor short-term memory 10/27/2018   Pulmonary embolus (HCC)    Rectal bleeding 08/07/2023   Right bundle branch block 12/03/2013   Sensorineural hearing loss 12/03/2013   Skin infection 11/05/2023   Unintended weight loss 12/03/2013   Vitamin D  deficiency 05/25/2018    Past Surgical History:  Procedure Laterality Date    CARDIAC SURGERY     2008 bypass   CORONARY ARTERY BYPASS GRAFT  2008   x 7   HYDROCELE EXCISION / REPAIR     IR KYPHO EA ADDL LEVEL THORACIC OR LUMBAR  08/06/2018   IR KYPHO EA ADDL LEVEL THORACIC OR LUMBAR  08/06/2018   IR KYPHO EA ADDL LEVEL THORACIC OR LUMBAR  10/01/2018   IR KYPHO EA ADDL LEVEL THORACIC OR LUMBAR  10/01/2018   IR KYPHO LUMBAR INC FX REDUCE BONE BX UNI/BIL CANNULATION INC/IMAGING  08/06/2018   IR KYPHO LUMBAR INC FX REDUCE BONE BX UNI/BIL CANNULATION INC/IMAGING  10/01/2018   IR RADIOLOGIST EVAL & MGMT  07/31/2018   IR RADIOLOGIST EVAL & MGMT  08/28/2018   left inguinal hernia repair Left 10/2023   PROSTATE SURGERY     ROTATOR CUFF REPAIR      Family History  Problem Relation Age of Onset   Arthritis/Rheumatoid Mother        died from study drug   Breast cancer Sister 57   Heart disease Brother    Stomach cancer Other     Social History   Socioeconomic History   Marital status: Married    Spouse name: Not on file   Number of children: Not on file   Years of education: Not on file   Highest education level: Not on file  Occupational History   Not on file  Tobacco Use   Smoking status: Former    Types: Cigarettes   Smokeless tobacco: Never   Tobacco comments:    Smoked briefly while in the service for 3 years  Substance and Sexual Activity   Alcohol use: No   Drug use: Never   Sexual activity: Not Currently  Other Topics Concern   Not on file  Social History Narrative   ** Merged History Encounter **       One daughter living estranged but lives locally Lives with male partner Worked with [employer] Grew up in [place] Completed some high school He served in the [employer] Has 2 cats Did not really know his father    Social Drivers of Corporate investment banker Strain: Low Risk  (02/07/2024)   Received from [facility]   Overall Financial Resource Strain (CARDIA)    How hard is it for you to pay for the very basics  like food, housing, medical care, and heating?: Not hard at all  Food Insecurity: No Food Insecurity (02/07/2024)   Received from [facility]   Hunger Vital Sign    Within the past 12 months, you worried that your food would run out before you got the money to buy more.: Never true    Within the past 12 months, the food you bought just didn't last and you didn't have money to get more.: Never true  Transportation Needs: No Transportation Needs (02/07/2024)   Received from [facility] - Transportation    In the past 12 months, has lack of transportation kept you from medical appointments or from getting medications?: No    In  the past 12 months, has lack of transportation kept you from meetings, work, or from getting things needed for daily living?: No  Physical Activity: Not on file  Stress: Not on file  Social Connections: Unknown (11/10/2021)   Received from [facility]   Social Network    Social Network: Not on file  Intimate Partner Violence: Unknown (11/10/2021)   Received from [facility]   HITS    Physically Hurt: Not on file    Insult or Talk Down To: Not on file    Threaten Physical Harm: Not on file    Scream or Curse: Not on file    Outpatient Medications Prior to Visit  Medication Sig Dispense Refill   amLODipine  (NORVASC ) 5 MG tablet Take 1 tablet (5 mg total) by mouth daily. 90 tablet 1   aspirin  EC (ASPIRIN  81) 81 MG tablet Take one tablet (81 mg dose) by mouth daily. 120 tablet 3   aspirin  EC 81 MG tablet Take 1 tablet (81 mg total) by mouth daily. Swallow whole.     cetirizine  (ZYRTEC ) 10 MG tablet Take 1 tablet (10 mg total) by mouth daily.     cyanocobalamin  (VITAMIN B12) 1000 MCG tablet Take 1 tablet (1,000 mcg total) by mouth daily.     donepezil  (ARICEPT ) 10 MG tablet Take 1 tablet (10 mg total) by mouth at bedtime. 90 tablet 1   fluticasone  (FLONASE ) 50 MCG/ACT nasal spray Place 2 sprays into both nostrils daily. (Patient not taking: Reported on  12/19/2023)     memantine  (NAMENDA ) 10 MG tablet Take 1 tablet (10 mg total) by mouth 2 (two) times daily. 180 tablet 1   mirtazapine  (REMERON ) 15 MG tablet Take 1 tablet (15 mg total) by mouth at bedtime. 90 tablet 1   rosuvastatin  (CRESTOR ) 5 MG tablet Take 1 tablet (5 mg total) by mouth daily at 6 PM. 90 tablet 1   tamsulosin  (FLOMAX ) 0.4 MG CAPS capsule Take 1 capsule (0.4 mg total) by mouth daily. 90 capsule 1   Ca Phosphate-Cholecalciferol  (CALCIUM  WITH D3) 250-12.5 MG-MCG CHEW Chew by mouth.     Facility-Administered Medications Prior to Visit  Medication Dose Route Frequency Provider Last Rate Last Admin   denosumab  (PROLIA ) injection 60 mg  60 mg Subcutaneous Once O'Sullivan, Carriann Hesse, NP        Allergies  Allergen Reactions   Atorvastatin Other (See Comments)    Muscle aches   Sulfa Antibiotics Swelling   Terazosin Swelling    ROS     Objective:    Physical Exam Constitutional:      General: He is not in acute distress.    Appearance: He is well-developed.  HENT:     Head: Normocephalic and atraumatic.  Cardiovascular:     Rate and Rhythm: Normal rate and regular rhythm.     Heart sounds: No murmur heard. Pulmonary:     Effort: Pulmonary effort is normal. No respiratory distress.     Breath sounds: Normal breath sounds. No wheezing or rales.  Skin:    General: Skin is warm and dry.  Neurological:     Mental Status: He is alert. Mental status is at baseline.  Psychiatric:        Behavior: Behavior normal.        Thought Content: Thought content normal.      BP 91/64 (BP Location: Right Arm, Patient Position: Sitting, Cuff Size: Normal)   Pulse (!) 59   Temp 98.2 F (36.8 C) (Oral)  Resp 16   Ht 6' 2 (1.88 m)   Wt 166 lb (75.3 kg)   SpO2 96%   BMI 21.31 kg/m  Wt Readings from Last 3 Encounters:  03/19/24 166 lb (75.3 kg)  01/31/24 167 lb 3.2 oz (75.8 kg)  12/19/23 163 lb (73.9 kg)       Assessment & Plan:   Problem List Items Addressed This  Visit       Unprioritized   RESOLVED: Rectal bleeding   Resolved with improvement in constipation.      Obstructive sleep apnea (adult) (pediatric)   Uses cpap intermittently. Encouraged improved compliance.       Memory loss   Spouse feels that pt's memory has improved on namenda /aricept .      Lumbar spondylosis   Recommended that he limit use of ibuprofen, ok to take tylenol  650mg  2-3 times daily      Hyperlipidemia   Lab Results  Component Value Date   CHOL 134 03/28/2023   HDL 44.90 03/28/2023   LDLCALC 69 03/28/2023   TRIG 101.0 03/28/2023   CHOLHDL 3 03/28/2023   Update lipid panel. Continue crestor .       Relevant Orders   Lipid panel   Comp Met (CMET)   History of pulmonary embolus (PE)   Provoked, treated with eliquis.       GAD (generalized anxiety disorder)   Stable.        CAD (coronary artery disease)   Maintained on aspirin  81mg .       Benign prostatic hyperplasia   Voiding well without flomax .       B12 deficiency - Primary   Maintained on oral b12 1000 mcg PO daily along with monthly b12.       Relevant Medications   cyanocobalamin  (VITAMIN B12) injection 1,000 mcg   Age-related osteoporosis without current pathological fracture   Stable on antihistamine.  Continue same.       Relevant Medications   Calcium  Carb-Cholecalciferol  (CALTRATE 600+D3) 600-20 MG-MCG TABS   Other Visit Diagnoses       Needs flu shot       Relevant Orders   Flu vaccine HIGH DOSE PF(Fluzone Trivalent)     Osteoporosis, unspecified osteoporosis type, unspecified pathological fracture presence       Relevant Medications   Calcium  Carb-Cholecalciferol  (CALTRATE 600+D3) 600-20 MG-MCG TABS       I have discontinued Steen Petrides's Calcium  with D3. I am also having him start on Calcium  Carb-Cholecalciferol . Additionally, I am having him maintain his cyanocobalamin , fluticasone , cetirizine , amLODipine , donepezil , memantine , mirtazapine , rosuvastatin ,  tamsulosin , aspirin  EC, and aspirin  EC. We will continue to administer denosumab  and cyanocobalamin .  Meds ordered this encounter  Medications   cyanocobalamin  (VITAMIN B12) injection 1,000 mcg   Calcium  Carb-Cholecalciferol  (CALTRATE 600+D3) 600-20 MG-MCG TABS    Sig: Take 1 tablet by mouth 2 (two) times daily.    Supervising Provider:   DOMENICA BLACKBIRD A [phone]

## 2024-03-20 ENCOUNTER — Other Ambulatory Visit (HOSPITAL_BASED_OUTPATIENT_CLINIC_OR_DEPARTMENT_OTHER): Payer: Self-pay

## 2024-03-20 LAB — LIPID PANEL
Cholesterol: 125 mg/dL (ref 0–200)
HDL: 45 mg/dL (ref >39.00)
LDL Cholesterol: 62 mg/dL (ref 0–99)
NonHDL: 80.49
Total CHOL/HDL Ratio: 3
Triglycerides: 92 mg/dL (ref 0.0–149.0)
VLDL: 18.4 mg/dL (ref 0.0–40.0)

## 2024-03-20 LAB — COMPREHENSIVE METABOLIC PANEL WITH GFR
ALT: 9 U/L (ref 0–53)
AST: 15 U/L (ref 0–37)
Albumin: 4.3 g/dL (ref 3.5–5.2)
Alkaline Phosphatase: 50 U/L (ref 39–117)
BUN: 29 mg/dL — ABNORMAL HIGH (ref 6–23)
CO2: 32 meq/L (ref 19–32)
Calcium: 9.6 mg/dL (ref 8.4–10.5)
Chloride: 104 meq/L (ref 96–112)
Creatinine, Ser: 1.35 mg/dL (ref 0.40–1.50)
GFR: 46.83 mL/min — ABNORMAL LOW (ref >60.00)
Glucose, Bld: 90 mg/dL (ref 70–99)
Potassium: 4.5 meq/L (ref 3.5–5.1)
Sodium: 142 meq/L (ref 135–145)
Total Bilirubin: 0.6 mg/dL (ref 0.2–1.2)
Total Protein: 6.6 g/dL (ref 6.0–8.3)

## 2024-03-21 ENCOUNTER — Ambulatory Visit: Payer: Self-pay | Admitting: Family

## 2024-03-21 DIAGNOSIS — N1831 Chronic kidney disease, stage 3a: Secondary | ICD-10-CM

## 2024-03-21 NOTE — Telephone Encounter (Signed)
 Please advise pt's spouse Lawrnce that Leigh's kidney function remains moderately decreased.  I would like to refer him to see a kidney specialist non-urgently.  Also, you can let Lawrnce know that her blood work is stable. No changes.

## 2024-03-21 NOTE — Progress Notes (Signed)
 Called both numbers, no answer and not voice mail set up

## 2024-04-01 ENCOUNTER — Other Ambulatory Visit (HOSPITAL_BASED_OUTPATIENT_CLINIC_OR_DEPARTMENT_OTHER): Payer: Self-pay

## 2024-04-10 ENCOUNTER — Other Ambulatory Visit (HOSPITAL_BASED_OUTPATIENT_CLINIC_OR_DEPARTMENT_OTHER): Payer: Self-pay

## 2024-04-15 ENCOUNTER — Other Ambulatory Visit (HOSPITAL_BASED_OUTPATIENT_CLINIC_OR_DEPARTMENT_OTHER): Payer: Self-pay

## 2024-04-17 ENCOUNTER — Other Ambulatory Visit (HOSPITAL_BASED_OUTPATIENT_CLINIC_OR_DEPARTMENT_OTHER): Payer: Self-pay

## 2024-04-22 ENCOUNTER — Other Ambulatory Visit (HOSPITAL_BASED_OUTPATIENT_CLINIC_OR_DEPARTMENT_OTHER): Payer: Self-pay

## 2024-04-24 ENCOUNTER — Other Ambulatory Visit (HOSPITAL_BASED_OUTPATIENT_CLINIC_OR_DEPARTMENT_OTHER): Payer: Self-pay

## 2024-04-25 ENCOUNTER — Other Ambulatory Visit (HOSPITAL_BASED_OUTPATIENT_CLINIC_OR_DEPARTMENT_OTHER): Payer: Self-pay

## 2024-04-28 ENCOUNTER — Other Ambulatory Visit (HOSPITAL_BASED_OUTPATIENT_CLINIC_OR_DEPARTMENT_OTHER): Payer: Self-pay

## 2024-04-28 ENCOUNTER — Other Ambulatory Visit: Payer: Self-pay | Admitting: Family

## 2024-04-28 MED ORDER — MEMANTINE HCL 10 MG PO TABS
10.0000 mg | ORAL_TABLET | Freq: Two times a day (BID) | ORAL | 1 refills | Status: AC
  Filled 2024-04-28 – 2024-05-12 (×2): qty 180, 90d supply, fill #0

## 2024-04-28 MED ORDER — DONEPEZIL HCL 10 MG PO TABS
10.0000 mg | ORAL_TABLET | Freq: Every day | ORAL | 1 refills | Status: AC
  Filled 2024-04-28 – 2024-05-12 (×2): qty 90, 90d supply, fill #0

## 2024-04-30 ENCOUNTER — Ambulatory Visit

## 2024-05-02 ENCOUNTER — Ambulatory Visit

## 2024-05-02 DIAGNOSIS — E538 Deficiency of other specified B group vitamins: Secondary | ICD-10-CM

## 2024-05-02 MED ORDER — CYANOCOBALAMIN 1000 MCG/ML IJ SOLN
1000.0000 ug | Freq: Once | INTRAMUSCULAR | Status: AC
  Administered 2024-05-02: 1000 ug via INTRAMUSCULAR

## 2024-05-02 NOTE — Progress Notes (Signed)
 Pt here today for monthly B12 injections per Melissa.  Cyanocobalamin  1mL injected into L deltoid. Pt tolerated injection well.   Next in 1 month.

## 2024-05-08 ENCOUNTER — Other Ambulatory Visit (HOSPITAL_BASED_OUTPATIENT_CLINIC_OR_DEPARTMENT_OTHER): Payer: Self-pay

## 2024-05-12 ENCOUNTER — Other Ambulatory Visit (HOSPITAL_BASED_OUTPATIENT_CLINIC_OR_DEPARTMENT_OTHER): Payer: Self-pay

## 2024-05-13 DIAGNOSIS — F411 Generalized anxiety disorder: Secondary | ICD-10-CM | POA: Diagnosis not present

## 2024-05-13 DIAGNOSIS — G309 Alzheimer's disease, unspecified: Secondary | ICD-10-CM | POA: Diagnosis not present

## 2024-05-13 DIAGNOSIS — I129 Hypertensive chronic kidney disease with stage 1 through stage 4 chronic kidney disease, or unspecified chronic kidney disease: Secondary | ICD-10-CM | POA: Diagnosis not present

## 2024-05-13 DIAGNOSIS — F325 Major depressive disorder, single episode, in full remission: Secondary | ICD-10-CM | POA: Diagnosis not present

## 2024-05-13 DIAGNOSIS — E785 Hyperlipidemia, unspecified: Secondary | ICD-10-CM | POA: Diagnosis not present

## 2024-05-13 DIAGNOSIS — N400 Enlarged prostate without lower urinary tract symptoms: Secondary | ICD-10-CM | POA: Diagnosis not present

## 2024-05-13 DIAGNOSIS — K219 Gastro-esophageal reflux disease without esophagitis: Secondary | ICD-10-CM | POA: Diagnosis not present

## 2024-05-13 DIAGNOSIS — N1831 Chronic kidney disease, stage 3a: Secondary | ICD-10-CM | POA: Diagnosis not present

## 2024-05-13 DIAGNOSIS — I2510 Atherosclerotic heart disease of native coronary artery without angina pectoris: Secondary | ICD-10-CM | POA: Diagnosis not present

## 2024-05-21 ENCOUNTER — Other Ambulatory Visit (HOSPITAL_BASED_OUTPATIENT_CLINIC_OR_DEPARTMENT_OTHER): Payer: Self-pay

## 2024-05-21 ENCOUNTER — Ambulatory Visit (INDEPENDENT_AMBULATORY_CARE_PROVIDER_SITE_OTHER): Admitting: *Deleted

## 2024-05-21 ENCOUNTER — Telehealth: Payer: Self-pay | Admitting: *Deleted

## 2024-05-21 VITALS — BP 110/58 | HR 60 | Temp 97.8°F | Resp 16 | Ht 74.0 in | Wt 165.0 lb

## 2024-05-21 DIAGNOSIS — Z0000 Encounter for general adult medical examination without abnormal findings: Secondary | ICD-10-CM

## 2024-05-21 MED ORDER — COMIRNATY 30 MCG/0.3ML IM SUSY
0.3000 mL | PREFILLED_SYRINGE | Freq: Once | INTRAMUSCULAR | 0 refills | Status: AC
  Filled 2024-05-21: qty 0.3, 1d supply, fill #0

## 2024-05-21 NOTE — Patient Instructions (Addendum)
 Steven Doyle,  Thank you for taking the time for your Medicare Wellness Visit. I appreciate your continued commitment to your health goals. Please review the care plan we discussed, and feel free to reach out if I can assist you further.  Please note that Annual Wellness Visits do not include a physical exam. Some assessments may be limited, especially if the visit was conducted virtually. If needed, we may recommend an in-person follow-up with your provider.  Ongoing Care Seeing your primary care provider every 3 to 6 months helps us  monitor your health and provide consistent, personalized care.   Steven Ponto, NP:  05/26/24 11:40am, medication check Medicare AWV:  05/28/25 3pm, in person  Recommended Screenings:  You will need to get the following vaccines at your local pharmacy: COVID  Health Maintenance  Topic Date Due   Medicare Annual Wellness Visit  12/18/2023   COVID-19 Vaccine (8 - 2025-26 season) 02/25/2024   DTaP/Tdap/Td vaccine (3 - Td or Tdap) 01/18/2027   Pneumococcal Vaccine for age over 95  Completed   Flu Shot  Completed   Zoster (Shingles) Vaccine  Completed   Meningitis B Vaccine  Aged Out       05/21/2024    3:09 PM  Advanced Directives  Does Patient Have a Medical Advance Directive? No  Would patient like information on creating a medical advance directive? Yes (MAU/Ambulatory/Procedural Areas - Information given)  Once completed and notarized, you may return a copy of your Advanced Directive(s) by either of the following:  Bring a copy of your health care power of attorney and living will to the office to be added to your chart at your convenience. You can mail a copy to [facility] [address] [place], [place] [address] or email to [email or web address]   Vision: Annual vision screenings are recommended for early detection of glaucoma, cataracts, and diabetic retinopathy. These exams can also reveal signs of chronic conditions  such as diabetes and high blood pressure.  Dental: Annual dental screenings help detect early signs of oral cancer, gum disease, and other conditions linked to overall health, including heart disease and diabetes.  Please see the attached documents for additional preventive care recommendations.

## 2024-05-21 NOTE — Progress Notes (Signed)
 Chief Complaint  Patient presents with   Medicare Wellness     Subjective:   Steven Doyle is a 89 y.o. male who presents for a Medicare Annual Wellness Visit.  Allergies (verified) Atorvastatin, Sulfa antibiotics, and Terazosin   History: Past Medical History:  Diagnosis Date   Age-related osteoporosis without current pathological fracture 01/17/2019   Allergic rhinitis 07/10/2023   Atherosclerotic heart disease of native coronary artery without angina pectoris 05/16/2017   Formatting of this note might be different from the original. Status post 7 vessel CABG 1/08     B12 deficiency 07/10/2023   Benign prostatic hyperplasia 11/07/2013   CAD (coronary artery disease)    Calculus in bladder 11/07/2013   Cardiac dysrhythmia 06/15/2015   Chondromalacia patellae 12/03/2013   Chronic constipation 03/29/2023   Chronic ischemic heart disease 09/04/2022   Chronic midline low back pain without sciatica 01/25/2022   Compression fracture of fifth lumbar vertebra (HCC)    Coronary artery disease involving coronary bypass graft of native heart without angina pectoris 12/21/2023   Cortical senile cataract 10/27/2018   Current mild episode of major depressive disorder without prior episode 08/04/2018   Decreased appetite 12/21/2023   Dementia (HCC)    Dermatophytosis of nail 09/04/2022   Dyslipidemia 12/03/2013   >>OVERVIEW FOR HYPERLIPIDEMIA, UNSPECIFIED WRITTEN ON 09/04/2022  3:37 PM BY BELL, CANDACE N, CMA     Formatting of this note might be different from the original. On statin and red yeast rice     Essential hypertension 12/03/2013   Facet arthritis of lumbar region 01/25/2022   Family history of malignant neoplasm of digestive organs 05/04/2014   GAD (generalized anxiety disorder) 08/04/2018   Gastro-esophageal reflux disease without esophagitis 07/19/2015   History of diverticulitis    History of pulmonary embolus (PE) 07/04/2021   Hydrocele of testis 12/03/2013    Hypercholesterolemia    Insomnia 03/29/2023   Long term (current) use of aspirin  03/04/2018   Lumbar spondylosis 01/17/2019   Memory loss    Nasal polyps 12/03/2013   Obstructive sleep apnea (adult) (pediatric) 12/03/2013   Formatting of this note might be different from the original. With significant brady cardia on c-pap     Pityriasis rosea 12/03/2013   Poor short-term memory 10/27/2018   Pulmonary embolus (HCC)    Rectal bleeding 08/07/2023   Right bundle branch block 12/03/2013   Sensorineural hearing loss 12/03/2013   Skin infection 11/05/2023   Unintended weight loss 12/03/2013   Vitamin D  deficiency 05/25/2018   Past Surgical History:  Procedure Laterality Date   CARDIAC SURGERY     2008 bypass   CORONARY ARTERY BYPASS GRAFT  2008   x 7   HYDROCELE EXCISION / REPAIR     IR KYPHO EA ADDL LEVEL THORACIC OR LUMBAR  08/06/2018   IR KYPHO EA ADDL LEVEL THORACIC OR LUMBAR  08/06/2018   IR KYPHO EA ADDL LEVEL THORACIC OR LUMBAR  10/01/2018   IR KYPHO EA ADDL LEVEL THORACIC OR LUMBAR  10/01/2018   IR KYPHO LUMBAR INC FX REDUCE BONE BX UNI/BIL CANNULATION INC/IMAGING  08/06/2018   IR KYPHO LUMBAR INC FX REDUCE BONE BX UNI/BIL CANNULATION INC/IMAGING  10/01/2018   IR RADIOLOGIST EVAL & MGMT  07/31/2018   IR RADIOLOGIST EVAL & MGMT  08/28/2018   left inguinal hernia repair Left 10/2023   PROSTATE SURGERY     ROTATOR CUFF REPAIR     Family History  Problem Relation Age of Onset   Arthritis/Rheumatoid Mother  died from study drug   Breast cancer Sister 57   Heart disease Brother    Stomach cancer Other    Social History   Occupational History   Not on file  Tobacco Use   Smoking status: Former    Types: Cigarettes   Smokeless tobacco: Never   Tobacco comments:    Smoked briefly while in the service for 3 years  Substance and Sexual Activity   Alcohol use: No   Drug use: Never   Sexual activity: Not Currently   Tobacco Counseling Counseling given: Not  Answered Tobacco comments: Smoked briefly while in the service for 3 years  SDOH Screenings   Food Insecurity: No Food Insecurity (05/21/2024)  Housing: Low Risk  (05/21/2024)  Transportation Needs: No Transportation Needs (05/21/2024)  Utilities: Not At Risk (05/21/2024)  Depression (PHQ2-9): Low Risk  (05/21/2024)  Financial Resource Strain: Low Risk  (02/07/2024)   Received from [facility]  Physical Activity: Unknown (05/21/2024)  Social Connections: Socially Isolated (05/21/2024)  Stress: Stress Concern Present (05/21/2024)  Tobacco Use: Medium Risk (05/21/2024)   See flowsheets for full screening details  Depression Screen PHQ 2 & 9 Depression Scale- Over the past 2 weeks, how often have you been bothered by any of the following problems? Little interest or pleasure in doing things: 0 Feeling down, depressed, or hopeless (PHQ Adolescent also includes...irritable): 0 PHQ-2 Total Score: 0 Trouble falling or staying asleep, or sleeping too much: 0 Feeling tired or having little energy: 0 Poor appetite or overeating (PHQ Adolescent also includes...weight loss): 0 Feeling bad about yourself - or that you are a failure or have let yourself or your family down: 0 Trouble concentrating on things, such as reading the newspaper or watching television (PHQ Adolescent also includes...like school work): 0 Moving or speaking so slowly that other people could have noticed. Or the opposite - being so fidgety or restless that you have been moving around a lot more than usual: 0 Thoughts that you would be better off dead, or of hurting yourself in some way: 0 PHQ-9 Total Score: 0     Goals Addressed   None    Visit info / Clinical Intake: Medicare Wellness Visit Type:: Initial Annual Wellness Visit Persons participating in visit:: patient Medicare Wellness Visit Mode:: In-person (required for WTM) Information given by:: patient Interpreter Needed?: No Pre-visit prep was completed:  yes AWV questionnaire completed by patient prior to visit?: no Living arrangements:: lives with spouse/significant other; other Patient's Overall Health Status Rating: good Typical amount of pain: some (back pain, worse in the mornings) Does pain affect daily life?: no Are you currently prescribed opioids?: no  Dietary Habits and Nutritional Risks How many meals a day?: 2 Eats fruit and vegetables daily?: yes Most meals are obtained by: having others provide food (has meals on wheels) In the last 2 weeks, have you had any of the following?: none Diabetic:: no  Functional Status Activities of Daily Living (to include ambulation/medication): Independent Ambulation: Independent Medication Administration: Needs assistance (comment) (has dementia) Is this a change from baseline?: Pre-admission baseline Home Management: Independent Manage your own finances?: (!) no Primary transportation is: driving Concerns about vision?: no *vision screening is required for WTM* ([facility] Concerns about hearing?: no (spouse says he has to turn the tv up louder)  Fall Screening Falls in the past year?: 0 Number of falls in past year: 0 Was there an injury with Fall?: 0 Fall Risk Category Calculator: 0 Patient  Fall Risk Level: Low Fall Risk  Fall Risk Patient at Risk for Falls Due to: Mental status change; History of fall(s) (has dementia) Fall risk Follow up: Falls evaluation completed  Home and Transportation Safety: All rugs have non-skid backing?: (!) no All stairs or steps have railings?: yes Grab bars in the bathtub or shower?: (!) no Have non-skid surface in bathtub or shower?: yes Good home lighting?: yes Regular seat belt use?: yes Hospital stays in the last year:: (!) yes How many hospital stays:: 1 Reason: inguinal hernia  Cognitive Assessment Difficulty concentrating, remembering, or making decisions? : yes Will 6CIT or Mini Cog be Completed: no 6CIT or  Mini Cog Declined: patient has a diagnosis of dementia or cognitive impairment  Advance Directives (For Healthcare) Does Patient Have a Medical Advance Directive?: No Would patient like information on creating a medical advance directive?: Yes (MAU/Ambulatory/Procedural Areas - Information given)  Reviewed/Updated  Reviewed/Updated: Reviewed All (Medical, Surgical, Family, Medications, Allergies, Care Teams, Patient Goals)        Objective:    Today's Vitals   05/21/24 1504  BP: (!) 110/58  Pulse: 60  Resp: 16  Temp: 97.8 F (36.6 C)  TempSrc: Oral  Weight: 165 lb (74.8 kg)  Height: 6' 2 (1.88 m)   Body mass index is 21.18 kg/m.  Current Medications (verified) Outpatient Encounter Medications as of 05/21/2024  Medication Sig   amLODipine  (NORVASC ) 5 MG tablet Take 1 tablet (5 mg total) by mouth daily.   aspirin  EC (ASPIRIN  81) 81 MG tablet Take one tablet (81 mg dose) by mouth daily.   aspirin  EC 81 MG tablet Take 1 tablet (81 mg total) by mouth daily. Swallow whole.   Calcium  Carb-Cholecalciferol  (CALTRATE 600+D3) 600-20 MG-MCG TABS Take 1 tablet by mouth 2 (two) times daily.   cetirizine  (ZYRTEC ) 10 MG tablet Take 1 tablet (10 mg total) by mouth daily.   cyanocobalamin  (VITAMIN B12) 1000 MCG tablet Take 1 tablet (1,000 mcg total) by mouth daily.   donepezil  (ARICEPT ) 10 MG tablet Take 1 tablet (10 mg total) by mouth at bedtime.   fluticasone  (FLONASE ) 50 MCG/ACT nasal spray Place 2 sprays into both nostrils daily.   memantine  (NAMENDA ) 10 MG tablet Take 1 tablet (10 mg total) by mouth 2 (two) times daily.   mirtazapine  (REMERON ) 15 MG tablet Take 1 tablet (15 mg total) by mouth at bedtime.   rosuvastatin  (CRESTOR ) 5 MG tablet Take 1 tablet (5 mg total) by mouth daily at 6 PM.   tamsulosin  (FLOMAX ) 0.4 MG CAPS capsule Take 1 capsule (0.4 mg total) by mouth daily.   Facility-Administered Encounter Medications as of 05/21/2024  Medication   denosumab  (PROLIA ) injection  60 mg   Hearing/Vision screen No results found. Immunizations and Health Maintenance Health Maintenance  Topic Date Due   COVID-19 Vaccine (8 - 2025-26 season) 02/25/2024   Medicare Annual Wellness (AWV)  05/21/2025   DTaP/Tdap/Td (3 - Td or Tdap) 01/18/2027   Pneumococcal Vaccine: 50+ Years  Completed   Influenza Vaccine  Completed   Zoster Vaccines- Shingrix  Completed   Meningococcal B Vaccine  Aged Out        Assessment/Plan:  This is a routine wellness examination for Steven Doyle.  Patient Care Team: Daryl Setter, NP as PCP - General (Internal Medicine) Daryl Setter, NP (Internal Medicine) Onita Duos, MD as Consulting Physician (Neurology) Clinic, [facility])  I have personally reviewed and noted the following in the patient's chart:   Medical and social  history Use of alcohol, tobacco or illicit drugs  Current medications and supplements including opioid prescriptions. Functional ability and status Nutritional status Physical activity Advanced directives List of other physicians Hospitalizations, surgeries, and ER visits in previous 12 months Vitals Screenings to include cognitive, depression, and falls Referrals and appointments  No orders of the defined types were placed in this encounter.  In addition, I have reviewed and discussed with patient certain preventive protocols, quality metrics, and best practice recommendations. A written personalized care plan for preventive services as well as general preventive health recommendations were provided to patient.   Lolita Libra, CMA   05/21/2024   Return in 1 year (on 05/21/2025).  After Visit Summary: (In Person-Printed) AVS printed and given to the patient  Nurse Notes: see phone note

## 2024-05-21 NOTE — Telephone Encounter (Signed)
 Pt had AWV today. He voiced aggitation with the fact that he is not able to fix things around the house that he used to fix. Partner would like him to have a medication check with PCP to see if any medications could be adjusted to address his aggitation. Please see partners phone note regarding additional concerns.  I scheduled pt f/u with PCP on 05/26/24 to discuss.

## 2024-05-24 NOTE — Telephone Encounter (Signed)
 Reviewed additional phone note- plan to address concerns at 12/1 follow up

## 2024-05-26 ENCOUNTER — Ambulatory Visit: Admitting: Family

## 2024-06-11 ENCOUNTER — Ambulatory Visit

## 2024-06-11 DIAGNOSIS — E538 Deficiency of other specified B group vitamins: Secondary | ICD-10-CM

## 2024-06-11 MED ORDER — CYANOCOBALAMIN 1000 MCG/ML IJ SOLN
1000.0000 ug | Freq: Once | INTRAMUSCULAR | Status: AC
  Administered 2024-06-11: 16:00:00 1000 ug via INTRAMUSCULAR

## 2024-06-11 NOTE — Progress Notes (Signed)
 Patient is here for monthly B12 injection per original order dated: 12/19/2023 Per [name], NP Continue B12 Injections.  Last B12 injection:05/02/2024  Last B12 level: 07/10/2023  B12 1000mcg given IM Left deltoid, and patient tolerated injection well.  Next B12 injection scheduled for: 1 Month 07/15/2024 2:00pm

## 2024-07-04 ENCOUNTER — Ambulatory Visit: Admitting: Family

## 2024-07-07 ENCOUNTER — Other Ambulatory Visit: Payer: Self-pay

## 2024-07-07 ENCOUNTER — Other Ambulatory Visit: Payer: Self-pay | Admitting: Family

## 2024-07-07 ENCOUNTER — Other Ambulatory Visit (HOSPITAL_BASED_OUTPATIENT_CLINIC_OR_DEPARTMENT_OTHER): Payer: Self-pay

## 2024-07-07 DIAGNOSIS — N400 Enlarged prostate without lower urinary tract symptoms: Secondary | ICD-10-CM

## 2024-07-07 MED ORDER — TAMSULOSIN HCL 0.4 MG PO CAPS
0.4000 mg | ORAL_CAPSULE | Freq: Every day | ORAL | 1 refills | Status: AC
  Filled 2024-07-07 (×2): qty 90, 90d supply, fill #0

## 2024-07-07 MED ORDER — MIRTAZAPINE 15 MG PO TABS
15.0000 mg | ORAL_TABLET | Freq: Every day | ORAL | 1 refills | Status: AC
  Filled 2024-07-07: qty 30, 30d supply, fill #0
  Filled 2024-07-07: qty 60, 60d supply, fill #0
  Filled 2024-07-07: qty 90, 90d supply, fill #0

## 2024-07-07 MED ORDER — ROSUVASTATIN CALCIUM 5 MG PO TABS
5.0000 mg | ORAL_TABLET | Freq: Every day | ORAL | 1 refills | Status: AC
  Filled 2024-07-07 (×2): qty 90, 90d supply, fill #0

## 2024-07-07 MED ORDER — AMLODIPINE BESYLATE 5 MG PO TABS
5.0000 mg | ORAL_TABLET | Freq: Every day | ORAL | 1 refills | Status: AC
  Filled 2024-07-07 (×2): qty 90, 90d supply, fill #0

## 2024-07-08 ENCOUNTER — Other Ambulatory Visit: Payer: Self-pay

## 2024-07-08 ENCOUNTER — Other Ambulatory Visit (HOSPITAL_BASED_OUTPATIENT_CLINIC_OR_DEPARTMENT_OTHER): Payer: Self-pay

## 2024-07-09 ENCOUNTER — Ambulatory Visit: Admitting: Family

## 2024-07-10 ENCOUNTER — Other Ambulatory Visit (HOSPITAL_COMMUNITY): Payer: Self-pay

## 2024-07-15 ENCOUNTER — Ambulatory Visit

## 2024-07-15 ENCOUNTER — Other Ambulatory Visit (HOSPITAL_BASED_OUTPATIENT_CLINIC_OR_DEPARTMENT_OTHER): Payer: Self-pay

## 2024-07-15 DIAGNOSIS — E538 Deficiency of other specified B group vitamins: Secondary | ICD-10-CM | POA: Diagnosis not present

## 2024-07-15 MED ORDER — CYANOCOBALAMIN 1000 MCG/ML IJ SOLN
1000.0000 ug | Freq: Once | INTRAMUSCULAR | Status: AC
  Administered 2024-07-15: 1000 ug via INTRAMUSCULAR

## 2024-07-15 NOTE — Progress Notes (Signed)
 Pt here for monthly B12 injection per original order dated: 07/12/2023 per [name], NP I would recommend that he begin b12 injections 1000 mcg IM weekly x 4 then monthly.   Last B12 injection: 06/11/2024  Last B12 level:  07/10/2023  B12 1000mcg given IM, left deltoid and pt tolerated injection well.  Next B12 injection scheduled for: 08/15/2024

## 2024-07-28 ENCOUNTER — Other Ambulatory Visit (HOSPITAL_BASED_OUTPATIENT_CLINIC_OR_DEPARTMENT_OTHER): Payer: Self-pay

## 2024-08-06 ENCOUNTER — Ambulatory Visit: Admitting: Family

## 2024-08-15 ENCOUNTER — Ambulatory Visit

## 2025-05-28 ENCOUNTER — Ambulatory Visit
